# Patient Record
Sex: Female | Born: 1970 | Race: Black or African American | Hispanic: No | Smoking: Never smoker
Health system: Southern US, Community
[De-identification: ages and names within clinical notes are randomized; demographics above are authoritative.]

## PROBLEM LIST (undated history)

## (undated) DIAGNOSIS — J189 Pneumonia, unspecified organism: Secondary | ICD-10-CM

## (undated) DIAGNOSIS — J810 Acute pulmonary edema: Secondary | ICD-10-CM

## (undated) DIAGNOSIS — G4733 Obstructive sleep apnea (adult) (pediatric): Secondary | ICD-10-CM

## (undated) DIAGNOSIS — J9610 Chronic respiratory failure, unspecified whether with hypoxia or hypercapnia: Secondary | ICD-10-CM

## (undated) DIAGNOSIS — D509 Iron deficiency anemia, unspecified: Secondary | ICD-10-CM

## (undated) HISTORY — DX: Iron deficiency anemia, unspecified: D50.9

## (undated) HISTORY — DX: Acute pulmonary edema: J81.0

## (undated) HISTORY — DX: Chronic respiratory failure, unspecified whether with hypoxia or hypercapnia: J96.10

## (undated) HISTORY — DX: Morbid (severe) obesity due to excess calories: E66.01

## (undated) HISTORY — PX: UTERINE FIBROID SURGERY: SHX826

## (undated) HISTORY — PX: REDUCTION MAMMAPLASTY: SUR839

## (undated) HISTORY — DX: Obstructive sleep apnea (adult) (pediatric): G47.33

---

## 2000-12-08 ENCOUNTER — Other Ambulatory Visit: Admission: RE | Admit: 2000-12-08 | Discharge: 2000-12-08 | Payer: Self-pay | Admitting: Obstetrics and Gynecology

## 2003-02-15 ENCOUNTER — Other Ambulatory Visit: Admission: RE | Admit: 2003-02-15 | Discharge: 2003-02-15 | Payer: Self-pay | Admitting: Obstetrics and Gynecology

## 2003-03-08 ENCOUNTER — Ambulatory Visit (HOSPITAL_COMMUNITY): Admission: RE | Admit: 2003-03-08 | Discharge: 2003-03-08 | Payer: Self-pay | Admitting: Obstetrics and Gynecology

## 2003-03-22 ENCOUNTER — Encounter (INDEPENDENT_AMBULATORY_CARE_PROVIDER_SITE_OTHER): Payer: Self-pay

## 2003-03-22 ENCOUNTER — Ambulatory Visit (HOSPITAL_COMMUNITY): Admission: RE | Admit: 2003-03-22 | Discharge: 2003-03-22 | Payer: Self-pay | Admitting: Obstetrics and Gynecology

## 2003-04-21 ENCOUNTER — Ambulatory Visit (HOSPITAL_BASED_OUTPATIENT_CLINIC_OR_DEPARTMENT_OTHER): Admission: RE | Admit: 2003-04-21 | Discharge: 2003-04-21 | Payer: Self-pay | Admitting: Internal Medicine

## 2003-04-21 ENCOUNTER — Encounter: Payer: Self-pay | Admitting: Internal Medicine

## 2003-04-21 DIAGNOSIS — G4733 Obstructive sleep apnea (adult) (pediatric): Secondary | ICD-10-CM

## 2003-04-21 HISTORY — DX: Obstructive sleep apnea (adult) (pediatric): G47.33

## 2003-07-01 ENCOUNTER — Inpatient Hospital Stay (HOSPITAL_COMMUNITY): Admission: EM | Admit: 2003-07-01 | Discharge: 2003-07-07 | Payer: Self-pay | Admitting: *Deleted

## 2003-07-01 DIAGNOSIS — J9610 Chronic respiratory failure, unspecified whether with hypoxia or hypercapnia: Secondary | ICD-10-CM

## 2003-07-01 DIAGNOSIS — J810 Acute pulmonary edema: Secondary | ICD-10-CM

## 2003-07-01 HISTORY — DX: Chronic respiratory failure, unspecified whether with hypoxia or hypercapnia: J96.10

## 2003-07-01 HISTORY — DX: Acute pulmonary edema: J81.0

## 2003-07-03 ENCOUNTER — Encounter (INDEPENDENT_AMBULATORY_CARE_PROVIDER_SITE_OTHER): Payer: Self-pay | Admitting: Cardiology

## 2004-07-08 ENCOUNTER — Encounter: Admission: RE | Admit: 2004-07-08 | Discharge: 2004-10-06 | Payer: Self-pay | Admitting: Internal Medicine

## 2004-09-09 ENCOUNTER — Ambulatory Visit: Payer: Self-pay | Admitting: Internal Medicine

## 2004-10-08 ENCOUNTER — Encounter: Admission: RE | Admit: 2004-10-08 | Discharge: 2005-01-06 | Payer: Self-pay | Admitting: Internal Medicine

## 2004-12-30 ENCOUNTER — Ambulatory Visit: Payer: Self-pay | Admitting: Internal Medicine

## 2005-01-11 ENCOUNTER — Ambulatory Visit: Payer: Self-pay | Admitting: Internal Medicine

## 2005-01-21 ENCOUNTER — Encounter: Admission: RE | Admit: 2005-01-21 | Discharge: 2005-04-21 | Payer: Self-pay | Admitting: Internal Medicine

## 2005-06-03 ENCOUNTER — Ambulatory Visit: Payer: Self-pay | Admitting: Internal Medicine

## 2005-06-11 ENCOUNTER — Encounter: Admission: RE | Admit: 2005-06-11 | Discharge: 2005-09-09 | Payer: Self-pay | Admitting: Internal Medicine

## 2005-08-11 ENCOUNTER — Ambulatory Visit: Payer: Self-pay | Admitting: Pulmonary Disease

## 2005-09-08 ENCOUNTER — Ambulatory Visit: Payer: Self-pay | Admitting: Internal Medicine

## 2005-09-09 ENCOUNTER — Ambulatory Visit: Payer: Self-pay | Admitting: Internal Medicine

## 2005-09-20 ENCOUNTER — Ambulatory Visit: Payer: Self-pay | Admitting: Internal Medicine

## 2005-10-04 ENCOUNTER — Ambulatory Visit: Payer: Self-pay | Admitting: Internal Medicine

## 2005-10-25 ENCOUNTER — Ambulatory Visit: Payer: Self-pay | Admitting: Internal Medicine

## 2005-11-16 ENCOUNTER — Ambulatory Visit: Payer: Self-pay | Admitting: Pulmonary Disease

## 2006-01-17 ENCOUNTER — Ambulatory Visit: Payer: Self-pay | Admitting: Internal Medicine

## 2006-02-23 ENCOUNTER — Ambulatory Visit: Payer: Self-pay | Admitting: Internal Medicine

## 2006-03-11 ENCOUNTER — Encounter (HOSPITAL_COMMUNITY): Admission: RE | Admit: 2006-03-11 | Discharge: 2006-03-14 | Payer: Self-pay | Admitting: Internal Medicine

## 2006-03-14 ENCOUNTER — Emergency Department (HOSPITAL_COMMUNITY): Admission: EM | Admit: 2006-03-14 | Discharge: 2006-03-14 | Payer: Self-pay | Admitting: Emergency Medicine

## 2006-07-19 ENCOUNTER — Ambulatory Visit: Payer: Self-pay | Admitting: Hematology & Oncology

## 2006-07-28 ENCOUNTER — Emergency Department (HOSPITAL_COMMUNITY): Admission: EM | Admit: 2006-07-28 | Discharge: 2006-07-29 | Payer: Self-pay | Admitting: Emergency Medicine

## 2006-08-01 ENCOUNTER — Ambulatory Visit: Payer: Self-pay | Admitting: Internal Medicine

## 2006-08-15 LAB — CBC & DIFF AND RETIC
Basophils Absolute: 0.1 10*3/uL (ref 0.0–0.1)
Eosinophils Absolute: 0.1 10*3/uL (ref 0.0–0.5)
HGB: 10.2 g/dL — ABNORMAL LOW (ref 11.6–15.9)
IRF: 0.47 — ABNORMAL HIGH (ref 0.130–0.330)
MCV: 73.3 fL — ABNORMAL LOW (ref 81.0–101.0)
MONO#: 0.3 10*3/uL (ref 0.1–0.9)
NEUT#: 7.4 10*3/uL — ABNORMAL HIGH (ref 1.5–6.5)
RDW: 18.6 % — ABNORMAL HIGH (ref 11.3–14.5)
RETIC #: 58 10*3/uL (ref 19.7–115.1)
Retic %: 1.3 % (ref 0.4–2.3)
WBC: 10.3 10*3/uL — ABNORMAL HIGH (ref 3.9–10.0)
lymph#: 2.4 10*3/uL (ref 0.9–3.3)

## 2006-08-15 LAB — CHCC SMEAR

## 2006-08-17 LAB — TRANSFERRIN RECEPTOR, SOLUABLE: Transferrin Receptor, Soluble: 102.1 nmol/L

## 2006-09-27 ENCOUNTER — Ambulatory Visit: Payer: Self-pay | Admitting: Hematology & Oncology

## 2006-09-29 LAB — FERRITIN: Ferritin: 268 ng/mL (ref 10–291)

## 2006-09-29 LAB — CBC WITH DIFFERENTIAL/PLATELET
BASO%: 1.1 % (ref 0.0–2.0)
Basophils Absolute: 0.1 10*3/uL (ref 0.0–0.1)
EOS%: 1.5 % (ref 0.0–7.0)
Eosinophils Absolute: 0.2 10*3/uL (ref 0.0–0.5)
HCT: 39.1 % (ref 34.8–46.6)
HGB: 11.6 g/dL (ref 11.6–15.9)
LYMPH%: 25.2 % (ref 14.0–48.0)
MCH: 23.3 pg — ABNORMAL LOW (ref 26.0–34.0)
MCHC: 29.6 g/dL — ABNORMAL LOW (ref 32.0–36.0)
MCV: 78.7 fL — ABNORMAL LOW (ref 81.0–101.0)
MONO#: 0.5 10*3/uL (ref 0.1–0.9)
MONO%: 4.9 % (ref 0.0–13.0)
NEUT#: 7.3 10*3/uL — ABNORMAL HIGH (ref 1.5–6.5)
NEUT%: 67.4 % (ref 39.6–76.8)
Platelets: 511 10*3/uL — ABNORMAL HIGH (ref 145–400)
RBC: 4.97 10*6/uL (ref 3.70–5.32)
RDW: 19.8 % — ABNORMAL HIGH (ref 11.3–14.5)
WBC: 10.9 10*3/uL — ABNORMAL HIGH (ref 3.9–10.0)
lymph#: 2.7 10*3/uL (ref 0.9–3.3)

## 2006-09-29 LAB — CHCC SMEAR

## 2006-10-10 ENCOUNTER — Ambulatory Visit: Payer: Self-pay | Admitting: Internal Medicine

## 2006-11-21 ENCOUNTER — Ambulatory Visit: Payer: Self-pay | Admitting: Hematology & Oncology

## 2006-11-24 LAB — CBC WITH DIFFERENTIAL/PLATELET
Basophils Absolute: 0.1 10*3/uL (ref 0.0–0.1)
EOS%: 1.9 % (ref 0.0–7.0)
HCT: 36.1 % (ref 34.8–46.6)
HGB: 11.8 g/dL (ref 11.6–15.9)
MCH: 26.8 pg (ref 26.0–34.0)
MONO#: 0.3 10*3/uL (ref 0.1–0.9)
NEUT%: 76.2 % (ref 39.6–76.8)
Platelets: 409 10*3/uL — ABNORMAL HIGH (ref 145–400)
lymph#: 1.8 10*3/uL (ref 0.9–3.3)

## 2007-01-24 ENCOUNTER — Ambulatory Visit: Payer: Self-pay | Admitting: Hematology & Oncology

## 2007-01-30 LAB — CBC WITH DIFFERENTIAL/PLATELET
Basophils Absolute: 0.1 10*3/uL (ref 0.0–0.1)
Eosinophils Absolute: 0.2 10*3/uL (ref 0.0–0.5)
HCT: 38.8 % (ref 34.8–46.6)
LYMPH%: 30.1 % (ref 14.0–48.0)
MONO#: 0.4 10*3/uL (ref 0.1–0.9)
NEUT#: 4.8 10*3/uL (ref 1.5–6.5)
NEUT%: 61.6 % (ref 39.6–76.8)
Platelets: 424 10*3/uL — ABNORMAL HIGH (ref 145–400)
WBC: 7.8 10*3/uL (ref 3.9–10.0)

## 2007-02-10 DIAGNOSIS — E678 Other specified hyperalimentation: Secondary | ICD-10-CM

## 2007-02-10 DIAGNOSIS — J302 Other seasonal allergic rhinitis: Secondary | ICD-10-CM

## 2007-02-10 DIAGNOSIS — J3089 Other allergic rhinitis: Secondary | ICD-10-CM

## 2007-02-10 DIAGNOSIS — J45909 Unspecified asthma, uncomplicated: Secondary | ICD-10-CM

## 2007-02-10 DIAGNOSIS — G4733 Obstructive sleep apnea (adult) (pediatric): Secondary | ICD-10-CM

## 2007-02-10 DIAGNOSIS — D649 Anemia, unspecified: Secondary | ICD-10-CM | POA: Insufficient documentation

## 2007-03-23 ENCOUNTER — Ambulatory Visit: Payer: Self-pay | Admitting: Hematology & Oncology

## 2007-04-10 ENCOUNTER — Ambulatory Visit: Payer: Self-pay | Admitting: Internal Medicine

## 2007-04-20 ENCOUNTER — Telehealth (INDEPENDENT_AMBULATORY_CARE_PROVIDER_SITE_OTHER): Payer: Self-pay | Admitting: *Deleted

## 2007-05-04 HISTORY — PX: GASTRIC BYPASS: SHX52

## 2007-06-26 ENCOUNTER — Telehealth (INDEPENDENT_AMBULATORY_CARE_PROVIDER_SITE_OTHER): Payer: Self-pay | Admitting: *Deleted

## 2007-07-14 ENCOUNTER — Ambulatory Visit: Payer: Self-pay | Admitting: Internal Medicine

## 2007-07-20 ENCOUNTER — Telehealth: Payer: Self-pay | Admitting: Internal Medicine

## 2007-09-18 ENCOUNTER — Telehealth: Payer: Self-pay | Admitting: Internal Medicine

## 2007-10-06 ENCOUNTER — Telehealth: Payer: Self-pay | Admitting: Internal Medicine

## 2007-10-16 DIAGNOSIS — Z9884 Bariatric surgery status: Secondary | ICD-10-CM | POA: Insufficient documentation

## 2007-10-17 ENCOUNTER — Encounter: Payer: Self-pay | Admitting: Internal Medicine

## 2007-10-29 ENCOUNTER — Inpatient Hospital Stay (HOSPITAL_COMMUNITY): Admission: AD | Admit: 2007-10-29 | Discharge: 2007-10-29 | Payer: Self-pay | Admitting: Obstetrics & Gynecology

## 2007-11-20 ENCOUNTER — Ambulatory Visit: Payer: Self-pay | Admitting: Internal Medicine

## 2008-02-19 ENCOUNTER — Telehealth (INDEPENDENT_AMBULATORY_CARE_PROVIDER_SITE_OTHER): Payer: Self-pay | Admitting: *Deleted

## 2008-03-04 ENCOUNTER — Ambulatory Visit: Payer: Self-pay | Admitting: Internal Medicine

## 2008-03-06 ENCOUNTER — Telehealth (INDEPENDENT_AMBULATORY_CARE_PROVIDER_SITE_OTHER): Payer: Self-pay | Admitting: *Deleted

## 2008-03-06 ENCOUNTER — Encounter: Payer: Self-pay | Admitting: Internal Medicine

## 2008-03-18 ENCOUNTER — Encounter: Payer: Self-pay | Admitting: Internal Medicine

## 2008-03-26 ENCOUNTER — Telehealth: Payer: Self-pay | Admitting: Internal Medicine

## 2008-05-06 ENCOUNTER — Telehealth: Payer: Self-pay | Admitting: Internal Medicine

## 2008-08-06 ENCOUNTER — Telehealth (INDEPENDENT_AMBULATORY_CARE_PROVIDER_SITE_OTHER): Payer: Self-pay | Admitting: *Deleted

## 2008-08-13 ENCOUNTER — Encounter (HOSPITAL_COMMUNITY): Admission: RE | Admit: 2008-08-13 | Discharge: 2008-11-11 | Payer: Self-pay | Admitting: Internal Medicine

## 2008-09-13 ENCOUNTER — Telehealth: Payer: Self-pay | Admitting: Internal Medicine

## 2008-09-13 ENCOUNTER — Ambulatory Visit: Payer: Self-pay | Admitting: Internal Medicine

## 2008-09-16 ENCOUNTER — Telehealth: Payer: Self-pay | Admitting: Internal Medicine

## 2009-02-24 ENCOUNTER — Encounter: Payer: Self-pay | Admitting: Internal Medicine

## 2009-03-12 ENCOUNTER — Ambulatory Visit: Payer: Self-pay | Admitting: Internal Medicine

## 2009-08-13 ENCOUNTER — Telehealth: Payer: Self-pay | Admitting: Internal Medicine

## 2009-10-03 ENCOUNTER — Ambulatory Visit: Payer: Self-pay | Admitting: Internal Medicine

## 2009-10-08 ENCOUNTER — Encounter: Payer: Self-pay | Admitting: Internal Medicine

## 2009-10-23 ENCOUNTER — Encounter: Payer: Self-pay | Admitting: Internal Medicine

## 2009-10-23 ENCOUNTER — Telehealth (INDEPENDENT_AMBULATORY_CARE_PROVIDER_SITE_OTHER): Payer: Self-pay | Admitting: *Deleted

## 2009-10-30 ENCOUNTER — Encounter: Payer: Self-pay | Admitting: Internal Medicine

## 2009-12-10 ENCOUNTER — Telehealth: Payer: Self-pay | Admitting: Internal Medicine

## 2010-05-07 ENCOUNTER — Ambulatory Visit
Admission: RE | Admit: 2010-05-07 | Discharge: 2010-05-07 | Payer: Self-pay | Source: Home / Self Care | Attending: Internal Medicine | Admitting: Internal Medicine

## 2010-05-23 ENCOUNTER — Encounter: Payer: Self-pay | Admitting: Obstetrics and Gynecology

## 2010-06-02 NOTE — Progress Notes (Signed)
Summary: cpap pressure high  Phone Note Call from Patient Call back at Home Phone [phone]   Caller: Patient Call For: young Reason for Call: Talk to Nurse Summary of Call: cpap pressure is too high.  [employer] told her that she needed to get an order from [facility].  pt says she can't seem to keep it on her face.  Pt is at work, you can leave detailed message.  She would like it if you just called [facility] and give them the order/rx. Initial call taken by: Eugene Gavia,  December 10, 2009 1:52 PM  Follow-up for Phone Call        LMOVM that [facility] is out of office this pm and will address this asap tomorrow. If any questions or concerns call me at the office.Reynaldo Minium CMA  December 10, 2009 2:58 PM   Additional Follow-up for Phone Call Additional follow up Details #1::        I will reduce pressure to 9 empirically. She has lost a lot of weight after surgery. If this isn't comforta ble we will autotitrate. Additional Follow-up by: Waymon Budge MD,  December 10, 2009 3:47 PM    New/Updated Medications: * CPAP 9 CWP [facility]

## 2010-06-02 NOTE — Progress Notes (Signed)
Summary: Set CPAP 14, see phone note  Phone Note Other Incoming   Summary of Call: CPAP form from [facility] shows they have her listed at 66, which was her original pressure. I can't tell when our records began listing at 10, which may be more appropriate after her weight loss. I wil lhave her set to 14 to split the difference and see how she does. Initial call taken by: Waymon Budge MD,  October 23, 2009 9:19 PM  Follow-up for Phone Call        order given to [facility] to set cpap@14cm   Follow-up by: Oneita Jolly,  October 24, 2009 9:10 AM    New/Updated Medications: * CPAP 14 CWP [facility]

## 2010-06-02 NOTE — Letter (Signed)
Summary: CMN for PAP Supplies/[facility]  CMN for PAP Supplies/[facility]   Imported By: Sherian Rein 11/05/2009 13:47:35  _____________________________________________________________________  External Attachment:    Type:   Image     Comment:   External Document

## 2010-06-02 NOTE — Letter (Signed)
Summary: PAP Supplies/[facility]  PAP Supplies/[facility]   Imported By: Sherian Rein 11/10/2009 09:39:50  _____________________________________________________________________  External Attachment:    Type:   Image     Comment:   External Document

## 2010-06-02 NOTE — Miscellaneous (Signed)
Summary: Orders Update-CPAP orders  Clinical Lists Changes  Orders: Added new Referral order of DME Referral (DME) - Signed

## 2010-06-02 NOTE — Assessment & Plan Note (Signed)
Summary: rov 6 months///kp   Primary Provider/Referring Provider:  Dorothyann Peng  CC:  6 month follow up visit-waking up with CPAP off; ? pressure change. Marland Kitchen  History of Present Illness: 03/04/08- Had CT for fibroids and may be facing surgery. Breathing well, much better sinc post bari surgery weight loss. Ia walking regularly on treadmill at gym. Not needing symbicort or nebulizer. Continues cpap 10, but no longer using O2 during sleep. Had flu vax.  09/13/08- Obesity/ bariatirc sgy, OSA, asthma, rhinits For hysterectomy next week. C/o pressure left ear with air noise but no pain. Did have some dizziness.Has appt to see Dr Lazarus Salines for this.  Asthma doing well. No recent infection. Little if any wheeze or cough. Denies chest pain, palpitation, dyspnea, leg pain. bloody or purulent discharge.  Compliant with cpap still at 10. Comfortable and sleeps well.   March 12, 2009- Obesity/ bariatric sgy, OSA, asthma, rhinitis Had hysterectomy w/out probs. She's very proud of her weight loss. She ordered cpap mask replacement on line. Continues good compliance at 10 but sometimes finds she's taken it off in her sleep. She's aware she snores without and sleeps better with cpap. Doing Yoga and going to gym.  October 03, 2009- Obesity/ bariatric surgery, OSA, asthma, rhinitis Has lost over 200 lbs after surgery. Walks everyday, going to gym.  She is taking CPAP off in her sleep now. Still falls asleep on phone and during day, but not while driving.. Brings CPAP but thinks mask fit is good, mask is comfortable. Asks to avoid a DME service charge. Working 2 jobs [employer] home 230-3AM, gets up [employer] up on weekends.  Taking Zyrtec for allergic rhinitis. Couldn't afford allergy shots.   Current Medications (verified): 1)  Protonix 40 Mg  Tbec (Pantoprazole Sodium) .... Take 1 Tablet By Mouth Two Times A Day 2)  Cpap .Marland Kitchen.. 10cm H2o Pressure 3)  Vitamin D (Ergocalciferol) 50000 Unit Caps (Ergocalciferol)  .... Weekly 4)  Singulair 10 Mg  Tabs (Montelukast Sodium) .Marland Kitchen.. 1 Daily 5)  Symbicort 160-4.5 Mcg/act  Aero (Budesonide-Formoterol Fumarate) .... 2 Puffs Two Times A Day 6)  Zoloft 100 Mg Tabs (Sertraline Hcl) .... Take 1 By Mouth Once Daily 7)  Iron 8)  Loratadine 10 Mg Tabs (Loratadine) .... Take 1 By Mouth Once Daily 9)  Deplin 15 Mg Tabs (L-Methylfolate) .... Take 1 By Mouth Once Daily 10)  Zyrtec Hives Relief 10 Mg Tabs (Cetirizine Hcl) .... Take 1 By Mouth Once Daily  Allergies (verified): No Known Drug Allergies  Past History:  Past Surgical History: Last updated: 11/20/2007 Uterine fibroid Gastric Bypass bariatric surgery 2009  Family History: Last updated: 07/14/2007 Father- asthma  Social History: Last updated: 03/12/2009 Patient never smoked.  Divorced, no children Works Location manager for [employer], also Research scientist (life sciences) and sorting  Risk Factors: Smoking Status: never (07/14/2007)  Past Medical History: hx Morbid obesity- bariatric surgery Allergic rhinitis Iron deficiency anemia due to menometrorrhagia Hxs of acute pulmonary edema, acute and chronic repiratory failure/ hosp 07/01/03 Obstructive sleep apnea NPSG AHI 101/hr, desat to 71% 04/21/03  Review of Systems      See HPI       The patient complains of weight loss.  The patient denies anorexia, fever, weight gain, vision loss, decreased hearing, hoarseness, chest pain, syncope, dyspnea on exertion, peripheral edema, prolonged cough, headaches, hemoptysis, abdominal pain, melena, hematochezia, and severe indigestion/heartburn.    Vital Signs:  Patient profile:   40 year old female Height:  64 inches Weight:      272 pounds BMI:     46.86 O2 Sat:      100 % on Room air Pulse rate:   65 / minute BP sitting:   110 / 66  (left arm) Cuff size:   regular  Vitals Entered By: Reynaldo Minium CMA (October 03, 2009 4:31 PM)  O2 Flow:  Room air  Physical Exam  Additional Exam:  General: A/Ox3; pleasant  and cooperative, NAD, obese, cheerfull,   SKIN: no rash, lesions NODES: no lymphadenopathy HEENT: [place]/AT, EOM- WNL, Conjuctivae- clear, PERRLA, TM-WNL- clear., Nose- clear, Throat- clear and wnl. Melampatti II=III NECK: Supple w/ fair ROM, JVD- none, normal carotid impulses w/o bruits Thyroid-  CHEST: Clear to P&A, shallow consisitent with habitus., Unlabored. HEART: RRR, no m/g/r heard ABDOMEN: Still obese but has lost a lot of weight VQQ:VZDG, nl pulses, no edema, no cords. NEURO: Grossly intact to observation. Affect pleasant- smiling and interactive.      Impression & Recommendations:  Problem # 1:  ALLERGIC RHINITIS (ICD-477.9)  Seasonal flare this Spring. Suggest nonsedating allegra or claritin. Her updated medication list for this problem includes:    Loratadine 10 Mg Tabs (Loratadine) .Marland Kitchen... Take 1 by mouth once daily    Zyrtec Hives Relief 10 Mg Tabs (Cetirizine hcl) .Marland Kitchen... 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 She will take the machine to her DME for service. She has not been getting enough sleep, but needs to address that, not change the cpap setting. We discussed sleep hygiene.  Medications Added to Medication List This Visit: 1)  Protonix 40 Mg Tbec (Pantoprazole sodium) .... Take 1 tablet by mouth two times a day 2)  Vitamin D (ergocalciferol) 50000 Unit Caps (Ergocalciferol) .... Weekly 3)  Deplin 15 Mg Tabs (L-methylfolate) .... Take 1 by mouth once daily 4)  Zyrtec Hives Relief 10 Mg Tabs (Cetirizine hcl) .... Take 1 by mouth once daily  Other Orders: Est. Patient Level IV (38756)  Patient Instructions: 1)  Please schedule a follow-up appointment in 6 months. 2)  See [facility] to look up who your CPAP home care company was. You can take your  machine to them for servce and filter change. 3)  Pressure remains at 10 4)  OK to try a Nodoz/ caffeine tablet once in a while, but healthy adults need to be getting 7-8 hours of sleep most nights.

## 2010-06-02 NOTE — Progress Notes (Signed)
Summary: rx-pt returned call from nurse  Phone Note Call from Patient Call back at Home Phone [phone]   Caller: Patient Call For: Mir Fullilove Reason for Call: Talk to Nurse Summary of Call: Eyes burning, itching, white discharge and swollen.  Thinks it is pollen, taking her Singulair and OTC Claritin.  Can you call something in? [facility] - [address] [facility] [address]. Initial call taken by: Eugene Gavia,  August 13, 2009 8:14 AM  Follow-up for Phone Call        [facility]. Is she taking her Loratadine?Michel Bickers CMA  August 13, 2009 10:33 AM  PT RETURNED CALL FROM TRIAGE. Tivis Ringer, CNA  August 13, 2009 12:33 PM  Additional Follow-up for Phone Call Additional follow up Details #1::        Pt c/o left eye itching, burning, running, swollen, with white discharge on eye lashes and corners of eye when she wakes up in the AM. Also PND, scratchy throat. Pt states her eye opens with ease. Pt has been rubbing her eye ( I did advise to stop doing that and use cool compresses) due to it feeling like sand is in it.    Pt is taking Singulair and OTC Claritin at the same time in the AM. Pt states she stopped her allergy vaccines years ago due to finances. Requesting something called to pharmacy. Pt has upcoming appointment Sep 11, 2009. Please advise. Zackery Barefoot CMA  August 13, 2009 12:55 PM   NKDA    Additional Follow-up for Phone Call Additional follow up Details #2::    Try Allway or Visine-AC first. these are otc and likely to be cheaper than prescription eye drops. Can also try Zyrtec/ cetirizine otc antihistamine and see if that works better than Claritin.--per dr Jahi Roza   lmtcb   Philipp Deputy CMA  August 13, 2009 1:46 PM   pt advised.Carron Curie CMA  August 13, 2009 3:29 PM

## 2010-06-04 NOTE — Assessment & Plan Note (Signed)
Summary: rov/jd   Primary Provider/Referring Provider:  Dorothyann Peng  CC:  Follow up visit-sleep no complaints and Acute illness today-slight cough, nasal congestion, sneezing, and denies any fever/chills or drainage.[name] [name]  History of Present Illness: March 12, 2009- Obesity/ bariatric sgy, OSA, asthma, rhinitis Had hysterectomy w/out probs. She's very proud of her weight loss. She ordered cpap mask replacement on line. Continues good compliance at 10 but sometimes finds she's taken it off in her sleep. She's aware she snores without and sleeps better with cpap. Doing Yoga and going to gym.  October 03, 2009- Obesity/ bariatric surgery, OSA, asthma, rhinitis Has lost over 200 lbs after surgery. Walks everyday, going to gym.  She is taking CPAP off in her sleep now. Still falls asleep on phone and during day, but not while driving.. Brings CPAP but thinks mask fit is good, mask is comfortable. Asks to avoid a DME service charge. Working 2 jobs [employer] home 230-3AM, gets up [employer] up on weekends.  Taking Zyrtec for allergic rhinitis. Couldn't afford allergy shots.  05/28/2010- Obesity/ bariatric surgery, OSA, asthma, rhinitis Nurse-CC: Follow up visit-sleep no complaints and Acute illness today-slight cough,nasal congestion,sneezing, denies any fever/chills or drainage. CPAP- pressure reduced empirically to 9. [facility]. Says this presure is now comfortable used all night every night.  Today is acutely ill also with bad cold that started yesterday, head congestion, sore throat, clear mucus, no fever yet. Chest still clear. Hx of asthma, but has not needed meds for this in quite a while.     Asthma History    Initial Asthma Severity Rating:    Age range: 12+ years    Symptoms: 0-2 days/week    Nighttime Awakenings: 0-2/month    Interferes w/ normal activity: no limitations    SABA use (not for EIB): 0-2 days/week    Asthma Severity Assessment: Intermittent   Preventive  Screening-Counseling & Management  Alcohol-Tobacco     Smoking Status: never  Current Medications (verified): 1)  Cpap 9 Cwp [facility] 2)  Vitamin D (Ergocalciferol) 50000 Unit Caps (Ergocalciferol) .... Take 1 By Mouth Tuesday and Fridays 3)  Singulair 10 Mg  Tabs (Montelukast Sodium) .[name] [name].. 1 Daily 4)  Zoloft 100 Mg Tabs (Sertraline Hcl) .... Take 2 By Mouth Once Daily 5)  Loratadine 10 Mg Tabs (Loratadine) .... Take 1 By Mouth Once Daily 6)  Deplin 15 Mg Tabs (L-Methylfolate) .... Take 1 By Mouth Once Daily 7)  Zyrtec Hives Relief 10 Mg Tabs (Cetirizine Hcl) .... Take 1 By Mouth Once Daily 8)  Ferralet 90 90-1 Mg Tabs (Fe Cbn-Fe Gluc-Fa-B12-C-Dss) .... Take 1 By Mouth Once Daily 9)  Eql Allergy Relief 10 Mg Tbdp (Loratadine) .... Take 1 By Mouth Once Daily As Needed 10)  Naproxen Sodium 220 Mg Tabs (Naproxen Sodium) .... Take As Needed As Directed Per Bottle  Allergies (verified): No Known Drug Allergies  Past History:  Past Medical History: Last updated: 10/03/2009 hx Morbid obesity- bariatric surgery Allergic rhinitis Iron deficiency anemia due to menometrorrhagia Hxs of acute pulmonary edema, acute and chronic repiratory failure/ hosp 07/01/03 Obstructive sleep apnea NPSG AHI 101/hr, desat to 71% 04/21/03  Past Surgical History: Last updated: 11/20/2007 Uterine fibroid Gastric Bypass bariatric surgery 2009  Family History: Last updated: 28-May-2010 Father- asthma Mother- died unknown cause  Social History: Last updated: 03/12/2009 Patient never smoked.  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  The patient denies shortness of breath at rest, productive  cough, non-productive cough, coughing up blood, chest pain, irregular heartbeats, acid heartburn, indigestion, loss of appetite, weight change, abdominal pain, difficulty swallowing, sore throat, tooth/dental problems, headaches, ear ache, rash, change in color of mucus, and fever.    Vital Signs:  Patient profile:   40 year old female Height:      64 inches Weight:      284.13 pounds BMI:     48.95 O2 Sat:      96 % on Room air Pulse rate:   80 / minute BP sitting:   126 / 82  (right arm) Cuff size:   large  Vitals Entered By: Reynaldo Minium CMA (May 07, 2010 3:59 PM)  O2 Flow:  Room air CC: Follow up visit-sleep no complaints and Acute illness today-slight cough,nasal congestion,sneezing, denies any fever/chills or drainage.   Physical Exam  Additional Exam:  General: A/Ox3; pleasant and cooperative, NAD, obese, cheerfull,   SKIN: no rash, lesions NODES: no lymphadenopathy HEENT: [place]/AT, EOM- WNL, Conjuctivae- clear, PERRLA, TM-WNL- clear., Nose- clear, Throat- clear and wnl. Mallampati  II=III NECK: Supple w/ fair ROM, JVD- none, normal carotid impulses w/o bruits Thyroid-  CHEST: Clear to P&A, shallow consisitent with habitus., Unlabored. HEART: RRR, no m/g/r heard ABDOMEN: Still obese but has lost a lot of weight ZOX:WRUE, nl pulses, no edema, no cords. NEURO: Grossly intact to observation. Affect pleasant- smiling and interactive.      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 further weight loss would help.  Problem # 2:  OBSTRUCTIVE SLEEP APNEA (ICD-327.23)  Good compliance and control with no change necessary on review with her now.  She does want mask refit as she has lost weight.   Problem # 3:  ALLERGIC RHINITIS (ICD-477.9)  She has a viral pattern URI, so far not extending into her chest. We are concerned about possible progression.  Her updated medication list for this problem  includes:    Loratadine 10 Mg Tabs (Loratadine) .[name] [name]... Take 1 by mouth once daily    Zyrtec Hives Relief 10 Mg Tabs (Cetirizine hcl) .[name] [name]... Take 1 by mouth once daily    Eql Allergy Relief 10 Mg Tbdp (Loratadine) .[name] [name]... Take 1 by mouth once daily as needed  Medications Added to Medication List This Visit: 1)  Cpap Mask of Choice and Supplies  2)  Vitamin D (ergocalciferol) 50000 Unit Caps (Ergocalciferol) .... Take 1 by mouth tuesday and fridays 3)  Zoloft 100 Mg Tabs (Sertraline hcl) .... Take 2 by mouth once daily 4)  Ferralet 90 90-1 Mg Tabs (Fe cbn-fe gluc-fa-b12-c-dss) .... Take 1 by mouth once daily 5)  Eql Allergy Relief 10 Mg Tbdp (Loratadine) .... Take 1 by mouth once daily as needed 6)  Naproxen Sodium 220 Mg Tabs (Naproxen sodium) .... Take as needed as directed per bottle 7)  Zithromax Z-pak 250 Mg Tabs (Azithromycin) .... 2 today then one daily  Other Orders: Est. Patient Level III (45409)  Patient Instructions: 1)  Please schedule a follow-up appointment in 6 months. 2)  Fluids, mucinex (plain) and maybe a decongestant like Afrin nasal spray or phenylephrine may help your cold 3)  script  to hld for Z pak in case you need an antibiotic later 4)  Continue CPAPat 9 5)  Replacement CPAP mask script Prescriptions: ZITHROMAX Z-PAK 250 MG TABS (AZITHROMYCIN) 2 today then one daily  #1 pak x 0   Entered and Authorized by:   Waymon Budge MD   Signed by:   Waymon Budge MD on 05/07/2010   Method used:   Print then Give to Patient   RxID:   [id number] CPAP MASK OF CHOICE AND SUPPLIES   #1 x prn   Entered and Authorized by:   Waymon Budge MD   Signed by:   Waymon Budge MD on 05/07/2010   Method used:   Print then Give to Patient   RxID:   [id number]

## 2010-08-03 ENCOUNTER — Emergency Department (HOSPITAL_BASED_OUTPATIENT_CLINIC_OR_DEPARTMENT_OTHER)
Admission: EM | Admit: 2010-08-03 | Discharge: 2010-08-04 | Disposition: A | Discharge status: Home / Self Care | Payer: Managed Care, Other (non HMO) | Attending: Emergency Medicine | Admitting: Emergency Medicine

## 2010-08-03 DIAGNOSIS — Z9109 Other allergy status, other than to drugs and biological substances: Secondary | ICD-10-CM | POA: Insufficient documentation

## 2010-08-03 DIAGNOSIS — R109 Unspecified abdominal pain: Secondary | ICD-10-CM | POA: Insufficient documentation

## 2010-08-03 DIAGNOSIS — E669 Obesity, unspecified: Secondary | ICD-10-CM | POA: Insufficient documentation

## 2010-08-03 DIAGNOSIS — K219 Gastro-esophageal reflux disease without esophagitis: Secondary | ICD-10-CM | POA: Insufficient documentation

## 2010-08-03 DIAGNOSIS — Z79899 Other long term (current) drug therapy: Secondary | ICD-10-CM | POA: Insufficient documentation

## 2010-08-03 DIAGNOSIS — G4730 Sleep apnea, unspecified: Secondary | ICD-10-CM | POA: Insufficient documentation

## 2010-08-03 DIAGNOSIS — J45909 Unspecified asthma, uncomplicated: Secondary | ICD-10-CM | POA: Insufficient documentation

## 2010-08-04 ENCOUNTER — Ambulatory Visit (HOSPITAL_BASED_OUTPATIENT_CLINIC_OR_DEPARTMENT_OTHER)
Admission: RE | Admit: 2010-08-04 | Discharge: 2010-08-04 | Disposition: A | Discharge status: Home / Self Care | Payer: Managed Care, Other (non HMO) | Source: Ambulatory Visit | Attending: Emergency Medicine | Admitting: Emergency Medicine

## 2010-08-04 DIAGNOSIS — R112 Nausea with vomiting, unspecified: Secondary | ICD-10-CM

## 2010-08-04 DIAGNOSIS — Z9884 Bariatric surgery status: Secondary | ICD-10-CM

## 2010-08-04 DIAGNOSIS — R1013 Epigastric pain: Secondary | ICD-10-CM | POA: Insufficient documentation

## 2010-08-04 LAB — URINALYSIS, ROUTINE W REFLEX MICROSCOPIC
Bilirubin Urine: NEGATIVE
Glucose, UA: NEGATIVE mg/dL
Hgb urine dipstick: NEGATIVE
Ketones, ur: NEGATIVE mg/dL
Nitrite: NEGATIVE
Protein, ur: NEGATIVE mg/dL
Specific Gravity, Urine: 1.027 (ref 1.005–1.030)
Urobilinogen, UA: 0.2 mg/dL (ref 0.0–1.0)
pH: 5.5 (ref 5.0–8.0)

## 2010-08-09 ENCOUNTER — Emergency Department (HOSPITAL_BASED_OUTPATIENT_CLINIC_OR_DEPARTMENT_OTHER)
Admission: EM | Admit: 2010-08-09 | Discharge: 2010-08-09 | Disposition: A | Discharge status: Home / Self Care | Payer: Managed Care, Other (non HMO) | Attending: Emergency Medicine | Admitting: Emergency Medicine

## 2010-08-09 DIAGNOSIS — G4730 Sleep apnea, unspecified: Secondary | ICD-10-CM | POA: Insufficient documentation

## 2010-08-09 DIAGNOSIS — Z79899 Other long term (current) drug therapy: Secondary | ICD-10-CM | POA: Insufficient documentation

## 2010-08-09 DIAGNOSIS — E669 Obesity, unspecified: Secondary | ICD-10-CM | POA: Insufficient documentation

## 2010-08-09 DIAGNOSIS — J45909 Unspecified asthma, uncomplicated: Secondary | ICD-10-CM | POA: Insufficient documentation

## 2010-08-09 DIAGNOSIS — K219 Gastro-esophageal reflux disease without esophagitis: Secondary | ICD-10-CM | POA: Insufficient documentation

## 2010-08-09 DIAGNOSIS — B379 Candidiasis, unspecified: Secondary | ICD-10-CM | POA: Insufficient documentation

## 2010-08-09 DIAGNOSIS — R112 Nausea with vomiting, unspecified: Secondary | ICD-10-CM | POA: Insufficient documentation

## 2010-08-09 DIAGNOSIS — R1031 Right lower quadrant pain: Secondary | ICD-10-CM | POA: Insufficient documentation

## 2010-08-09 LAB — COMPREHENSIVE METABOLIC PANEL
ALT: 24 U/L (ref 0–35)
AST: 29 U/L (ref 0–37)
Albumin: 4 g/dL (ref 3.5–5.2)
Alkaline Phosphatase: 125 U/L — ABNORMAL HIGH (ref 39–117)
BUN: 15 mg/dL (ref 6–23)
CO2: 20 mEq/L (ref 19–32)
Calcium: 8.8 mg/dL (ref 8.4–10.5)
Chloride: 113 mEq/L — ABNORMAL HIGH (ref 96–112)
Creatinine, Ser: 0.8 mg/dL (ref 0.4–1.2)
GFR calc Af Amer: >60 mL/min (ref >60)
GFR calc non Af Amer: >60 mL/min (ref >60)
Glucose, Bld: 102 mg/dL — ABNORMAL HIGH (ref 70–99)
Potassium: 4.2 mEq/L (ref 3.5–5.1)
Sodium: 147 mEq/L — ABNORMAL HIGH (ref 135–145)
Total Bilirubin: 0.3 mg/dL (ref 0.3–1.2)
Total Protein: 7.9 g/dL (ref 6.0–8.3)

## 2010-08-09 LAB — URINALYSIS, ROUTINE W REFLEX MICROSCOPIC
Bilirubin Urine: NEGATIVE
Glucose, UA: NEGATIVE mg/dL
Ketones, ur: NEGATIVE mg/dL
Leukocytes, UA: NEGATIVE
Nitrite: NEGATIVE
Protein, ur: NEGATIVE mg/dL
Specific Gravity, Urine: 1.028 (ref 1.005–1.030)
Urobilinogen, UA: 0.2 mg/dL (ref 0.0–1.0)
pH: 5.5 (ref 5.0–8.0)

## 2010-08-09 LAB — CBC
HCT: 34.3 % — ABNORMAL LOW (ref 36.0–46.0)
Hemoglobin: 10.8 g/dL — ABNORMAL LOW (ref 12.0–15.0)
MCH: 24.6 pg — ABNORMAL LOW (ref 26.0–34.0)
MCHC: 31.5 g/dL (ref 30.0–36.0)
MCV: 78.1 fL (ref 78.0–100.0)
Platelets: 440 10*3/uL — ABNORMAL HIGH (ref 150–400)
RBC: 4.39 MIL/uL (ref 3.87–5.11)
RDW: 16.8 % — ABNORMAL HIGH (ref 11.5–15.5)
WBC: 10.2 10*3/uL (ref 4.0–10.5)

## 2010-08-09 LAB — DIFFERENTIAL
Basophils Absolute: 0 10*3/uL (ref 0.0–0.1)
Basophils Relative: 0 % (ref 0–1)
Eosinophils Absolute: 0.3 10*3/uL (ref 0.0–0.7)
Eosinophils Relative: 3 % (ref 0–5)
Lymphocytes Relative: 31 % (ref 12–46)
Lymphs Abs: 3.1 10*3/uL (ref 0.7–4.0)
Monocytes Absolute: 0.7 10*3/uL (ref 0.1–1.0)
Monocytes Relative: 7 % (ref 3–12)
Neutro Abs: 6 10*3/uL (ref 1.7–7.7)
Neutrophils Relative %: 59 % (ref 43–77)

## 2010-08-09 LAB — URINE MICROSCOPIC-ADD ON

## 2010-08-09 LAB — PREGNANCY, URINE: Preg Test, Ur: NEGATIVE

## 2010-08-09 LAB — LIPASE, BLOOD: Lipase: 29 U/L (ref 23–300)

## 2010-08-12 LAB — CROSSMATCH
ABO/RH(D): B POS
Antibody Screen: NEGATIVE

## 2010-09-15 NOTE — Assessment & Plan Note (Signed)
[facility]                             PULMONARY OFFICE NOTE   NAME:Guterrez, NILZA EAKER                      MRN:          [id number]  DATE:10/10/2006                            DOB:          08-23-1970    PROBLEM LIST:  1. Asthma.  2. Obstructive sleep apnea.  3. Morbid obesity.  4. Chronic hypoxic respiratory failure secondary to obesity      hypoventilation syndrome.  5. Allergic rhinitis.   HISTORY:  She complains that she can't get her breath.  No cough,  wheeze, chest pain.  She sleeps with an oxygen concentrator running at  2L but does not have portable oxygen.  She does use CPAP at 10 CWP.  She  is exploring bariatric surgery in [place].  The program here  needed her to lose weight first.  She had a blood transfusion and iron  replacement and is now on Depo-Lupron to suppress vaginal bleeding.  Anemia managed through Dr. Myna Hidalgo.   MEDICATIONS:  1. CPAP 10 CWP.  2. Furosemide 40 mg.  3. Protonix 40 mg.  4. Vitamin C.  5. Oxygen at 2L through [facility], used so far just at night.   PHYSICAL EXAMINATION:  VITAL SIGNS:  Weight per patient is 469 pounds.  Blood pressure 142/88. Pulse 101.  Oxygen saturation 98% on 2L, 81% on  room air.  She is wearing her oxygen during this exam.  She is morbidly  obese.  LUNGS:  Breath sounds are very shallow consistent with her body habitus,  but otherwise clear.  NECK:  I can't distinguish neck veins.  There is no stridor.  HEART:  Sounds are regular.  I don't hear a murmur.  EXTREMITIES:  There is no ankle edema.  No cyanosis.   IMPRESSION:  1. Obesity hypoventilation syndrome secondary to morbid obesity.  2. Hypoxic respiratory failure.  3. Asthma and allergic rhinitis.  4. Anemia compounding her dyspnea.  I think the acute change in the      last few days likely reflects the air quality problems superimposed      on her difficulty taking a deep breath.   PLAN:  1. I have  explained obesity hypoventilation syndrome to her,      demonstrated how she simply can't take much of a breath and we      discussed weight loss.  We are going to add portable oxygen using a      light portable system that she can cope with through [facility].  Today she is given a nebulizer treatment with Xopenex      1.25 mg to see if that has any      effect.  At her request, she is given a leave of absence for one      week and we discussed      disability application.  2. Schedule a return for two months, earlier p.r.n.     Clinton D. [name], MD, Tonny Bollman, FACP  Electronically Signed    [facility]  DD: 10/13/2006  DT: 10/13/2006  Job #:  [id number] 

## 2010-09-18 NOTE — H&P (Signed)
NAME:  [name] [name], [name] [name]                         ACCOUNT NO.:  [id number]   MEDICAL RECORD NO.:  [id number]                   PATIENT TYPE:  INP   LOCATION:  1825                                 FACILITY:  [facility]   PHYSICIAN:  Clinton D. [name], M.D.              DATE OF BIRTH:  January 16, 1971   DATE OF ADMISSION:  07/01/2003  DATE OF DISCHARGE:                                HISTORY & PHYSICAL   ADMISSION DIAGNOSES:  1. Pulmonary infiltrates.  2. Acute and toxic respiratory failure.  3. Asthma.   HISTORY OF PRESENT ILLNESS:  This 40 year old black female, nonsmoker, is  admitted with chief complaint of shortness of breath after presenting at our  office today by Dr. Webb Laws, who asked me to evaluate for admission.  Over the past two months, she has had gradually worsening exertional dyspnea  such that she cannot walk across the parking lot to get to her employment.  When she was seen in the office on January 27, a chest x-ray was interpreted  as showing changes of obesity, and she was thought to have an asthma  exacerbation because of significant wheeze and cough.  Her last previous  asthma flare had been in September.  She was treated in January with Solu-  Medrol, then a Medrol taper, Advair 500/50, Singulair and a Z-Pak and  instructed to return in a week.  She returned today saying the dyspnea had  been worse, and she was afraid last night that she might die.   REVIEW OF SYSTEMS:  Some substernal tussive discomfort, but no anginal  pattern pain or pleuritic pains, no palpitation.  She denies sputum, rigors  or fever.  There has been no adenopathy, and nothing bloody or purulent.  She slept on 1-2 pillows noting leg edema, right greater than left without  leg pain.  She had more wheeze and cough in the early stage of this illness,  but says these actually improved some in the last few weeks.  Dyspnea is the  overwhelming complaint now.   PAST MEDICAL HISTORY:  1. Allergic  rhinitis as a teenager.  She began noting wheeze and cough about     5 years ago.  2. She gained up from 160 pounds in 1997 to over 350 pounds now.  3. Denies heart disease, DVT/pulmonary embolism, history of kidney or liver     disease or any other lung disease.  4. Denies exposure to tuberculosis and has not had flu or pneumococcal     vaccine.  5. Denies risks for HIV.  6. Hospitalization only for uterine fibroid.  7. Has no children.  8. She had a nocturnal polysomnogram at the [facility] one     year ago, and thinks it showed some apnea but is not sure.  She was told     that she needed to loose weight but was not fitted with CPAP.  SOCIAL HISTORY:  Divorced, never smoked, lives alone, no children, no close  relatives around here.  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   FAMILY HISTORY:  Father had asthma, lives in [place] with no recent  contact.  She has no contact with her mother, and apparently there are no  close relatives.   PHYSICAL EXAMINATION:  VITAL SIGNS:  Now distinctly more comfortable than  she was in the office, lying with head of bed elevated 45 degrees, wearing  oxygen at 4 liters.  Saturation was 60% in the office on room air and is 98%  now.  Heart rate 102, office blood pressure was 120/87, weight recorded well  over 350 pounds.  GENERAL APPEARANCE:  She is an alert, now much calmer, oriented woman,  massively obese.  SKIN:  No rash.  No adenopathy.  HEENT:  Mucosa clear.  No neck vein distention or stridor.  Conjunctivae are  clear.  Pupils reactive.  CHEST:  Shallow, blunt and expiratory wheeze in the office, quite labored  then.  No rhonchi.  Breath sounds are so distant I really cannot hear any  rales or wheeze, but she did have some wheeze in the office.  HEART:  Regular rhythm.  Very faint and distant.  I hear no rub or murmur.  BREAST:  Massive without discharge or palpable  mass.  ABDOMEN:  Softly obese.  Very quite.  No hepatosplenomegaly detectable.  GU/RECTAL:  No examined.  Not directly pertinent now.  EXTREMITIES:  Heavy legs with 1+ edema bilaterally to the knees.  Probably a  little worse in the right leg.  No obvious tenderness or cords.  Negative  Homan's.   CHEST X-RAY:  Our office film showed cardiomegaly with diffuse air space  disease, pneumonia versus edema, worse than a mild but similar pattern in  late January.  Previous films had shown no significant air space disease  allowing for obesity.   IMPRESSION:  1. Pending all lab work, I am impressed we are seeing gradually worsening     dyspnea, cough with infiltrates.  The contact suggests  progressive     edema, possibly cardiogenic.  I have not identified a source per an ARDS     or pneumocystis pneumonia.  I suppose an influenza and pneumonia are     similar.  Atypical pneumonia is a possibility.  2. Past history of asthma.  3. Morbid obesity.   PLAN:  Pending labs, we are going to diurese her and start Avelox against  the possibility of bacterial infection.  Will cover with Lovenox for DVT  prophylaxis, Doppler her legs and check D. dimer.  She seems more  comfortable now, but we will put her on BiPAP if necessary.  A lot depends  on how she responds to initial interventions.                                                Clinton D. [name], M.D.    CDY/MEDQ  D:  07/01/2003  T:  07/01/2003  Job:  [id number]

## 2010-09-18 NOTE — Discharge Summary (Signed)
NAME:  [name] [name], [name] [name]                         ACCOUNT NO.:  [id number]   MEDICAL RECORD NO.:  [id number]                   PATIENT TYPE:  INP   LOCATION:  5727                                 FACILITY:  [facility]   PHYSICIAN:  Clinton D. [name], M.D.              DATE OF BIRTH:  14-May-1970   DATE OF ADMISSION:  07/01/2003  DATE OF DISCHARGE:  07/07/2003                                 DISCHARGE SUMMARY   DISCHARGE DIAGNOSES:  1. Acute pulmonary edema.  2. 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  3. Asthma.  4. Morbid obesity.  5. Obstructive sleep apnea with hypersomnolence.  6. Iron-deficiency anemia secondary to menstrual blood loss.  7. Hypopotassemia.   BRIEF HISTORY:  This is a 40 year old non-smoking African-American woman  followed for primary medical care by Dr. Dossie Arbour and followed in our  office by Dr. Webb Laws for allergic asthma, who presented through the  office with a two-month history of gradually increasing dyspnea.  She had  been treated on an outpatient basis for asthma.  But chest x-ray on my  review in January suggested vascular congestion and, on the day of  admission, frank diffuse air space disease consistent with pulmonary edema.  Chest x-rays were technically difficult because of her body habitus.  She  was admitted because of worsening dyspnea.   PAST MEDICAL HISTORY:  1. Asthma.  2. Allergic rhinitis.  3. Weight gain from 160 pounds in 1997 to recorded weight during current     hospitalization of 416 pounds.  4. Obstructive sleep apnea, intolerance of initial trial of CPAP and BiPAP.  5. Surgery for uterine fibroids.   Physical examination on admission was significant for oxygen saturation of  98% on 4 liter prongs with head of bed elevated.  Weight measured in  hospital 416 pounds. There was no neck vein distention or stridor and no  adenopathy or rash.  Respiratory pattern was grunting with expiratory wheeze  bilaterally muffled by her chest wall.  There was 1+ bilateral leg edema to  the knees.  She was admitted for diuresis and supplemental oxygen and  diagnostic evaluation.   HOSPITAL COURSE:  She diuresed some on the first day with initial B-  natruretic peptide of 96, white count 13,600.  No evidence of acute coronary  damage.  D-dimer 1.5 with negative Dopplers.  Cardiology was consulted, and  she was seen by [facility] with impression she was not in  left-sided heart failure.  She was too heavy to fit on a gurney for a CT.  Ventilation perfusion lung scan and Dopplers of legs were low probability.  She was noted to be anemic with an iron deficiency pattern, and she was  having pelvic bleeding.  Dr. Matthias Hughs saw her in gastroenterology  consultation and did a flexible sigmoidoscopy with the impression that this  was probably menstrual bleeding.  She was  transiently hypokalemic, and this  was corrected.  Her leukocytosis resolved, raising suggestion she had an  acute infection as part of the process, although chest x-ray pattern was not  typical for a bacterial pneumonia.  A viral pneumonia could not be  completely excluded.  She had been maintained on DVT prophylaxis with  Lovenox.  It was recognized she had been on a birth control pill prior to  admission, and the increased risk of venous thromboembolic disease in the  context of birth control pills and her obesity was explained to her so she  could discuss it with her GYN.  She was already enrolled to attend a  bariatric surgery introductory class.  She received dietary counseling in  weight-loss diet.  Efforts were made to get her to accommodate CPAP or  BiPAP, but she could tolerate it only briefly.  She remained hypoxic on room  air, ambulating in the hallway at the end of her hospital stay with room air  saturations recorded 70 to 80% on March 6.  She was felt to have reached  maximum hospital benefit and  discharged home with final explanation for her  pulmonary edema picture not completely clear with possibility of an atypical  pneumonia or noncardiogenic pulmonary edema.   LABORATORY AND X-RAY DATA:  Doppler leg vein evaluation negative for DVT.   Transthoracic echocardiogram by Dr. Clarene Duke showed overall left ventricular  systolic function normal with left ventricular ejection fraction estimated  55 to 65%, left ventricular wall thickness mildly to moderately increased,  right ventricle mildly dilated, trivial paracardial effusion.   EKG showed normal sinus rhythm.   Initial chest x-ray read as cardiomegaly with bilateral pulmonary edema.  Subsequent films showed improvement in air space disease, diffuse pneumonia  versus edema.   Ventilation perfusion scan:  Low probability.   ABGs on 4-liter nasal cannula on February 28 showed pH 7.36, PCO2 50, PO2  96, bicarbonate 28.  ABG on room air July 04, 2003, showed pH 7.45, PCO2 41,  PO2 62, bicarbonate 28.  Initial WBC 13,600, hemoglobin 8.6, hematocrit 29,  normal platelets, 85% neutrophils with large platelets, polychromasia,  target cells and elliptocytes.  Microcytic indices.  Final WBC on March 6  was 11,400 with hemoglobin 8.1, hematocrit 27.  Sedimentation rate was 68.  D-dimer 1.5.  Initial pro time 14.8, INR 1.2, PTT 28.  Initial chemistries  normal except for glucose 128 and albumin 3.1.  Final glucose 106, BUN 8,  creatinine 0.8.  Liver enzymes normal.  Hemoglobin A1C 7.4 (4.6 to 6.1).  Counseled in diabetic diet.  TSH was normal at 2.164.  Serum iron 14, iron  binding capacity 431 with 3% saturation, ferritin 7, all consistent with  iron-deficiency anemia.  Urinalysis showed greater than 300 protein,  otherwise unremarkable.  Cultures all negative for bacteria.   DISCHARGE PLANS:  1. Leave of absence from work until office followup.  2. Oxygen 1 liter per minute continuously until followup. 3. She is to speak with Dr.  Edward Jolly, her GYN, about birth control pills and     risk of DVT.  4. She is to keep her bariatric surgery class.  5. Weight-loss diet, to avoid starches and carbohydrates.   DISCHARGE MEDICATIONS:  1. Albuterol inhaler 2 puffs four times a day p.r.n.  2. K-Dur 20 mEq b.i.d.  3. Furosemide 40 mg daily.  4. Nu-Iron 150 mg b.i.d.  5. Altace 5 mg daily.  6. Continue allergy vaccine injections.   FOLLOW UP:  Office followup with Dr. [name] in one week and with Dr. Eloise Harman  and Dr. Edward Jolly as planned.                                                Clinton D. [name], M.D.    CDY/MEDQ  D:  08/02/2003  T:  08/04/2003  Job:  [id number]   cc:   Madaline Savage, M.D.  [address], [address]  [place]  [place] [address]  Fax: [phone]   Bernette Redbird, M.D.  [address]  [place], [place] [address]  Fax: [phone]   Barry Dienes. Eloise Harman, M.D.  [address]  [place]  [place] [address]  Fax: [phone]   Randye Lobo, M.D.  [address], [address]  [place]  [place] [address]  Fax: [phone]

## 2010-09-18 NOTE — Assessment & Plan Note (Signed)
 HEALTHCARE                               PULMONARY OFFICE NOTE   NAME:Musick, FIA HEBERT                      MRN:          [id number]  DATE:11/16/2005                            DOB:          [birth year]    CHIEF COMPLAINT:  Wheezing x 3 days.   HISTORY OF PRESENT ILLNESS:  Ms. Laquinta Hazell is a morbidly obese, greater  350 lbs., African American female, who presents with three days of  increasing shortness of breath without fevers, chills, sweat or sputum  production.  Notes she continues to eat late at night and noted she wakes up  coughing.  She continues to have this problems intermittently.  She presents  today with a question of whether or not she needs home nebulizers.   PHYSICAL EXAMINATION:  GENERAL:  Morbidly obese African American female, who  has a hoarse voice and obvious increased pursed lip breathing.  HEENT:  Short neck.  No JVD.  CHEST:  Proximal wheezes that clear with pursed lip breathing.  CARDIAC:  Heart sounds are regular, regular rate rhythm.  ABDOMEN:  Obese, soft and non-tender.  GU AND RECTAL:  Deferred.  EXTREMITIES:  Warm with mild edema.  VITAL SIGNS:  BLOOD PRESSURE:  132/92.  PULSE:  90.  O2 SATS:  97% on room  air.   IMPRESSION AND PLAN:  1.  Vocal cord disruption most likely with a gastroesophageal reflux      trigger.  She does have a hoarse voice that clears with vocal exercise,      consisting of pursed lip breathing and formation of the alphabet.  She      does have an anxiety and depression component.  She was given extensive      instructions on proper pursed lip breathing and formation of the ABCs.      She is also being treated with one months' supply of proton pump      inhibitors.  I have asked her to follow-up in three weeks with myself or      the nurse practitioner, Anders Simmonds.  2.  Morbid obesity. She has been instructed in weight loss.  This is tending      to be an ongoing problem.  3.   Obstructive sleep apnea.  She is on a C-PAP at 10 cm, per Dr. [name],      which is working well.  4.  Allergic rhinitis and asthma.  She is followed by Dr. Jetty Duhamel for      this.  Will continue to follow-up.                                   Devra Dopp, MSN, ACNP   SM/MedQ  DD:  11/16/2005  DT:  11/17/2005  Job #:  [id number]

## 2010-09-18 NOTE — Consult Note (Signed)
NAME:  [name] [name], [name] [name]                         ACCOUNT NO.:  [id number]   MEDICAL RECORD NO.:  [id number]                   PATIENT TYPE:  INP   LOCATION:  2901                                 FACILITY:  [facility]   PHYSICIAN:  Bernette Redbird, M.D.                DATE OF BIRTH:  [birth year]   DATE OF CONSULTATION:  07/03/2003  DATE OF DISCHARGE:                                   CONSULTATION   Dr. [name] asked [place] to see this 40 year old female because of possible rectal  bleeding.   [name] [name] has no prior history of rectal bleeding or other GI problems.  She  was admitted to the hospital a couple of days ago because of progressive  dyspnea.  She has a history of asthma and morbid obesity.   She is due for her menstrual period and has a history of uterine fibroids.  Earlier today, she was noted to have roughly 100 mL of fairly fresh blood on  her pad.  It was unclear whether or not it was of menstrual or rectal  origin.  She subsequently had a few small streaks and then sat on the  commode and there was quite a bit of both blood and stool present.  The  patient denies any abdominal pain.  It was not really possible to tell  whether the blood was separate from the stool in its origin or not.  In view  of this, we were asked to see the patient to help sort things out.   ALLERGIES:  No known drug allergies.   OUTPATIENT MEDICATIONS:  1. Advair.  2. Albuterol.  3. Medrol.  4. She did receive one dose of Lovenox after being admitted to the hospital.   PAST MEDICAL HISTORY:  (Obtained from review of old chart)  1. Morbid obesity, contemplating gastric bypass surgery.  She has apparently     gained approximately 200 pounds over the past seven or eight years.  2. There is possible history of sleep apnea.  3. She has no history of coronary disease, chronic lung disease, previous     pulmonary embolism or diabetes.   PAST SURGICAL HISTORY:  None other than removal of uterine fibroids in  November of 2004.   HABITS:  Nonsmoker and nondrinker.   FAMILY HISTORY:  Negative for GI tract illnesses.   SOCIAL HISTORY:  Divorced, originally from [place], [place].  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   REVIEW OF SYMPTOMS:  The patient states she has no problem with constipation  nor any history of previous rectal bleeding.  As noted, she is approximately  due for a menstrual cycle at this time, since it has been almost exactly  four weeks since her last period.   PHYSICAL EXAMINATION:  GENERAL APPEARANCE:  [name] [name] is a pleasant,  articulate, African American female who is morbidly obese.  HEENT:  She is anicteric and without  frank pallor.  LUNGS:  Breath sounds are hard to hear.  I do not hear any high pitched  wheezes or significant rhonchi,, just some soft low pitched rhonchi.  CARDIOVASCULAR:  Heart sounds are regular but somewhat distant, no murmurs  appreciated, no gallops heard.  ABDOMEN:  Morbidly obese but not tender and, as noted above, no history of  abdominal pain in association with this bleeding.   IMPRESSION:  Probable uterine/menstrual bleeding, although a hemorrhoidal  bleed would also be possible.  There is no evidence of clinically  significant hemorrhage.  Note that the patient's admission hemoglobin of 8.6  was basically stable when rechecked today at 8.9.  She is, however,  microcytic consistent with iron-deficiency anemia.  She had an RDW on  admission of 23 and a platelet count of 556.   RECOMMENDATIONS:  1. Bedside sigmoidoscopic evaluation to help discern whether or nontender     the bleeding is of rectal origin.  2. Check iron studies to confirm current clinical impression of iron-     deficiency as the source of her anemia.  3. The patient will be a very poor candidate for evaluation of the GI tract,     given her morbid obesity, but it would probably be reasonable to check     home Hemoccults at the time when she is not  menstruating and if they are     consistently negative, probably no further work-up of the GI tract would     be warranted.                                               Bernette Redbird, M.D.    RB/MEDQ  D:  07/03/2003  T:  07/04/2003  Job:  [id number]   cc:   Madaline Savage, M.D.  [address]  [place]  [place] [address]  Fax: [phone]

## 2010-09-18 NOTE — Assessment & Plan Note (Signed)
[facility]                               PULMONARY OFFICE NOTE   NAME:[name] [name], [name] [name]                      MRN:          [id number]  DATE:01/17/2006                            DOB:          Apr 03, 1971    HISTORY OF PRESENT ILLNESS:  Patient is a 40 year old African-American  female patient of Dr. Roxy Cedar who has a known history of allergic rhinitis,  asthma and obstructive sleep apnea presents for a 1 week history of  productive cough with thick yellow sputum, wheezing and shortness of breath.  She denies any hemoptysis, chest pain, orthopnea, PND or increased leg  swelling.   CURRENT MEDICATIONS:  Are correct.   PHYSICAL EXAMINATION:  Patient is a pleasant morbidly obese female in no  acute distress.  She is afebrile with stable vital signs.  02 saturation is 96% on room air.  HEENT:  Unremarkable.  NECK:  Supple with no adenopathy.  LUNGS:  Lung sounds reveal a few expiratory wheezes bilaterally.  CARDIAC:  Regular rate.  ABDOMEN:  Soft, non-tender.   IMPRESSION/PLAN:  1. Acute exacerbation of asthmatic bronchitis.  The patient is to begin a      Z pack.  Mucinex DM twice daily.  Prednisone taper over the next 6      days.  2. Patient is to return with Dr. [name] in one month or sooner if needed.                                   Rubye Oaks, NP                                Clinton D. [name], MD, [facility], FACP   TP/MedQ  DD:  01/17/2006  DT:  01/18/2006  Job #:  [id number]

## 2010-09-18 NOTE — Assessment & Plan Note (Signed)
[facility]                             PULMONARY OFFICE NOTE   NAME:[name] [name], [name] [name]                      MRN:          [id number]  DATE:08/01/2006                            DOB:          1971/02/18    HISTORY OF PRESENT ILLNESS:  The patient is a 40 year old African-  American female patient of Dr. [name] who has a known history of asthma,  morbid obesity, obstructive sleep apnea, allergic rhinitis, who presents  for a followup.  The patient was recently seen on July 29, 2006 at the  emergency room for an asthmatic flare.  The patient was given a  prednisone taper and nebulizer treatment.  The patient reports symptoms  have improved.  However, the patient did have a chest x-ray, which had  some vascular congestion in the bases.  The patient denied any increased  lower extremity swelling.  The patient did have some worsening shortness  of breath, but also had wheezing.   PAST MEDICAL HISTORY:  Reviewed.   CURRENT MEDICATIONS:  Reviewed.   PHYSICAL EXAM:  The patient is a morbidly obese female, greater than 400  pounds in no acute distress.  She is afebrile with stable vital signs.  O2 saturation is 96% on room  air.  HEENT:  Unremarkable.  NECK:  Supple without adenopathy.  No JVD.  LUNGS:  Sounds are diminished in the bases.  No wheezing is noted.  CARDIAC:  Regular rate.  ABDOMEN:  Morbidly obese, soft, with a large panniculus.  EXTREMITIES:  Warm without any calf tenderness, cyanosis, clubbing.  There is trace edema with venous insufficiency changes.   ASSESSMENT:  1. Recent asthmatic flare.  The patient is to taper prednisone as      scheduled.  Continue on her present regimen and follow back up with      Dr. [name] in 2 weeks or sooner if needed.  2. Questionable some fluid overload recently on chest x-ray.  Suspect      this is just some      atelectasis secondary to the patient's size.  Will compare old      films for comparison and  discuss with Dr. [name].     Rubye Oaks, NP  Electronically Signed      Clinton D. [name], MD, [facility], FACP  Electronically Signed   TP/MedQ  DD: 08/03/2006  DT: 08/03/2006  Job #: [id number]

## 2010-09-18 NOTE — Assessment & Plan Note (Signed)
[facility]                               PULMONARY OFFICE NOTE   NAME:[name] [name]                      MRN:          [id number]  DATE:02/23/2006                            DOB:          [birth year]    PROBLEM:  1. Asthma.  2. Obstructive sleep apnea.  3. Morbid obesity.  4. Allergic rhinitis.   HISTORY:  Since I last saw her in May, she has been seen here twice by the  nurse practitioners for wheezing, exacerbations of dyspnea, treated as  asthmatic bronchitis. She is worked in again today for the same complaint.  She is working two jobs. She says she was breathing and walking better after  last year in September until last week, she started a familiar pattern of  wheezing, dyspnea again. She has recognized no sore throat, fever, chest  pain, or palpitations. She is not sure if she is retained fluid or not and  for lack of a better guess, she blames weather change. She is coughing more.  She was told she is too heavy for the bariatric surgery program here and she  is looking for alternatives that would take her.   MEDICATIONS:  1. CPAP at 10 CWP.  2. Advair 100/50 one puff b.i.d.  3. Protonix 40 mg.  4. Vitamin C.  5. Albuterol inhaler.  6. Allegra 180.  7. Mucinex.   ALLERGIES:  No medication allergy.   OBJECTIVE:  Is well over 350 pounds, over the limit of our scale. Blood  pressure: 132/84.  Pulse: Regular, 90.  Room air saturation: 93%. She is  sadly overweight.  Breath sounds are shallow, consistent with body habitus and there is faint  diffuse wheeze.  There may be trace neck vein distention, hard to tell. There is no stridor  or obvious pharyngeal erythema or nasal congestion now.  Heart sounds are muffled and regular without murmur.  She does not seem to have edema in her feet and Homan's is negative.   IMPRESSION:  Acute exacerbation of asthma, possibly with a fluid retention  component. The primary problem is morbid  obesity.   PLAN:  1. Flu vaccine.  2. Nebulizer treatment, Xopenex 1.25 mg.  3. Take furosemide 40 mg daily x 2 days, then p.r.n.; noting that she has      it home, but had not      been taking it.  4. Schedule return six weeks, earlier p.r.n.       Clinton D. [name], MD, FCCP, FACP      CDY/MedQ  DD:  02/23/2006  DT:  02/24/2006  Job #:  [id number]

## 2010-09-18 NOTE — Op Note (Signed)
NAME:  [name] [name], [name] [name]                         ACCOUNT NO.:  [id number]   MEDICAL RECORD NO.:  [id number]                   PATIENT TYPE:  AMB   LOCATION:  SDC                                  FACILITY:  [facility]   PHYSICIAN:  Randye Lobo, M.D.                DATE OF BIRTH:  November 26, 1970   DATE OF PROCEDURE:  03/22/2003  DATE OF DISCHARGE:                                 OPERATIVE REPORT   PREOPERATIVE DIAGNOSES:  1. Prolapsing cervical fibroid.  2. Anemia.   POSTOPERATIVE DIAGNOSES:  1. Prolapsing cervical fibroid.  2. Anemia.   PROCEDURE:  1. Cervical myomectomy  2. Dilation and curettage.   SURGEON:  Randye Lobo, M.D.   ANESTHESIA:  Spinal.   FLUIDS REPLACED:  400 mL Ringer's lactate.   URINE OUTPUT:  50 mL.   ESTIMATED BLOOD LOSS:  Minimal.   COMPLICATIONS:  None.   INDICATION FOR PROCEDURE:  The patient is a 40 year old gravida 0 African-  American female with menorrhagia and cramping.  The patient was seen for  abnormal menstruation and was placed on oral contraceptives with no  response.  The patient was examined in my office and was found to have a  normal cervix at that time.  The bimanual exam was limited due to the  patient's obesity status.  An ultrasound performed on March 18, 2003,  documented a uterine size of 9.2 x 4.2 x 3.8 cm with several small fibroids  up to 14 mm present.  The endometrial stripe was difficult to see but was  thought to be 10 mm.  There was a question of a calcified mass approximately  3 cm in diameter in the right adnexal region.  It was uncertain if this  represented a fibroid or if it represented an ovarian mass.  The patient at  that time was found to be slightly anemic and was started on iron therapy  t.i.d.  The patient was seen in the office again on March 20, 2003, and  she had significant increase in her bleeding over the last 48 hours.  She  was diagnosed with a prolapsing 3 cm cervical fibroid.  Her hemoglobin  measured 7.5 at that time.  Plans were made at that time to proceed with a  cervical myomectomy after the risks, benefits, and alternatives were  discussed with her.   FINDINGS:  Examination under anesthesia revealed a 3 cm prolapsing cervical  fibroid on an extremely thin stalk.  The cervix itself was dilated but  otherwise normal.  A moderate amount of endometrial curettings was obtained  and sent to pathology.   SPECIMENS:  The cervical fibroid was sent to pathology separately from the  endometrial curettings.   DESCRIPTION OF PROCEDURE:  With an IV in place, the patient was taken to the  operating room after she was properly identified.  The patient did receive  Ancef 1 g intravenously for  antibiotic prophylaxis.  In the operating room a  spinal anesthetic was administered and the patient was then placed in the  dorsal lithotomy position.  The vagina and perineum were then sterilely  prepped and the bladder was sterilely catheterized of urine.  She was then  sterilely draped.   An examination under anesthesia was performed and the findings are as noted  above.  A single-tooth tenaculum was placed on the prolapsing cervical  fibroid, and a long Kelly clamp was placed across the thin stalk.  This was  sharply excised and was sent to pathology.  Before any suture could be  placed on the stalk or cautery could be performed, the pedicle site released  from the clamp.  There was no active bleeding noted at this time.  The  cervix was then dilated to #27 [facility] dilator.  The endometrium was curetted  first with a smooth and then with a serrated curette such that all four  quadrants of the uterus had a gritty texture to them.  The endometrial  curettings were sent to pathology.  The operative site was examined at this  time, and hemostasis was noted to be excellent.  The procedure was therefore  concluded.  The patient was cleansed of any remaining Betadine.  She was  taken out of the  dorsal lithotomy position, and she was escorted to the  recovery room in stable and awake condition.  There were no complications to  the procedure.  All needle, instrument, and sponge counts were correct.                                               Randye Lobo, M.D.    BES/MEDQ  D:  03/22/2003  T:  03/22/2003  Job:  [id number]

## 2010-09-18 NOTE — Op Note (Signed)
NAME:  [name] [name], [name] [name]                         ACCOUNT NO.:  [id number]   MEDICAL RECORD NO.:  [id number]                   PATIENT TYPE:  INP   LOCATION:  2901                                 FACILITY:  [facility]   PHYSICIAN:  Bernette Redbird, M.D.                DATE OF BIRTH:  [birth year]   DATE OF PROCEDURE:  07/03/2003  DATE OF DISCHARGE:                                 OPERATIVE REPORT   PROCEDURE:  Flexible sigmoidoscopy.   ENDOSCOPIST:  Bernette Redbird, M.D.   INDICATIONS:  This is a 40 year old female admitted to the hospital with  breathing difficulties a couple of days ago.  She is morbidly obese and was  placed on Lovenox prophylaxis.  She started having passage of blood from the  peroneal area, unclear whether of vaginal or rectal origin although when she  had a bowel movement, there was blood in the commode raising the question of  rectal bleeding.   FINDINGS:  Normal exam to 25 cm with formed, brown stool present and no  blood or source of bleeding identified.   DESCRIPTION OF PROCEDURE:  The nature, purpose, and risks of the procedure  have been discussed with the patient who provided written consent.  The  procedure was done at the bedside in her hospital room without any IV  sedation and without any prep being administered.  Perianal exam did not  demonstrate any fissures, fistulae, prolapsed hemorrhoids, or other  pathology.  There did appear to be some fresh mucoid blood anteriorly as  might be arising from the vaginal area.  I did not do a bimanual exam.  Digital exam showed normal anal sphincter tone, perhaps slightly increased.  The Olympus pediatric video colonoscope was advanced to about 25 mL without  difficulty, encountering a generous amount of formed brown stool (no  impaction).  No blood was present in the rectosigmoid lumen.  The stool was  not tinged with any blood or maroon residue.  No source of bleeding was  identified, specifically, I did not  encounter any diverticular disease,  colitis, proctitis, vascular ectasis, polyps or masses.  Retroflexion in the  rectum was normal, and pullout through the anal canal really did not  demonstrate any significant internal hemorrhoids.  No biopsies were  obtained.  The patient tolerated the procedure well.   IMPRESSION:  1. No source of bleeding identified on this exam.  2. By exclusion, blood was almost certainly of vaginal origin (menstrual).   PLAN:  We will plan to send the patient a set of home Hemoccults as added  reassurance that her microcytic anemia is now occurring from chronic GI  blood loss (doubt) .  Bernette Redbird, M.D.    RB/MEDQ  D:  07/03/2003  T:  07/04/2003  Job:  [id number]   cc:   Madaline Savage, M.D.  [address]  [place]  [place] [address]  Fax: [phone]   Clinton D. Young, M.D.  [address] [place]  [place] [address]  Fax: [phone]

## 2010-11-06 ENCOUNTER — Ambulatory Visit: Payer: Self-pay | Admitting: Internal Medicine

## 2010-11-24 ENCOUNTER — Encounter: Payer: Self-pay | Admitting: Internal Medicine

## 2010-11-26 ENCOUNTER — Encounter: Payer: Self-pay | Admitting: Internal Medicine

## 2010-11-26 ENCOUNTER — Ambulatory Visit (INDEPENDENT_AMBULATORY_CARE_PROVIDER_SITE_OTHER): Payer: Managed Care, Other (non HMO) | Admitting: Internal Medicine

## 2010-11-26 VITALS — BP 102/82 | HR 88 | Ht 64.0 in | Wt 299.4 lb

## 2010-11-26 DIAGNOSIS — J45909 Unspecified asthma, uncomplicated: Secondary | ICD-10-CM

## 2010-11-26 DIAGNOSIS — E678 Other specified hyperalimentation: Secondary | ICD-10-CM

## 2010-11-26 DIAGNOSIS — G4733 Obstructive sleep apnea (adult) (pediatric): Secondary | ICD-10-CM

## 2010-11-26 MED ORDER — BENZONATATE 200 MG PO CAPS: 200.0000 mg | ORAL_CAPSULE | Freq: Three times a day (TID) | ORAL | Status: AC | PRN

## 2010-11-26 NOTE — Assessment & Plan Note (Signed)
Recent onset tracheobronchitis, nonspecific. We will try Symbicort and tessalon

## 2010-11-26 NOTE — Patient Instructions (Signed)
Script benzonatate for cough as needed  Sample Symbicort 160    2 puffs then rinse mouth well, twice daily  Please call as needed

## 2010-11-26 NOTE — Assessment & Plan Note (Signed)
Good compliance and control. Unfortunately she has regained considerable weight and is encouraged to get that turned around while she can.

## 2010-11-26 NOTE — Progress Notes (Signed)
Subjective:    Patient ID: [name] [name], female    DOB: 1971/01/13, 40 y.o.   MRN: [id number]  HPI 11/26/10- 9 yo never smoker followed for OSA, asthma, obesity/hypoventilation (previous bariatric surgery), complicated by relapsing obesity Last here May 07, 2010 Says she has gained weight, but today is "my first day back on track" to lose it again.  CPAP 9,  likes new machine. Able to use it all night every night.  New cough began 2 weeks ago without obvious trigger. She is in draft at work. Denies nose or sinus trouble.  Denies reflux or nocturnal choking.  Notices some dyspnea walking in heat.   Review of Systems Constitutional:   No-   weight loss, night sweats, fevers, chills, fatigue, lassitude. HEENT:   No-   headaches, difficulty swallowing, tooth/dental problems, sore throat,                  No-   sneezing, itching, ear ache, nasal congestion, post nasal drip,   CV:  No-   chest pain, orthopnea, PND, swelling in lower extremities, anasarca, dizziness, palpitations  GI:  No-   heartburn, indigestion, abdominal pain, nausea, vomiting, diarrhea,                 change in bowel habits, loss of appetite  Resp: No- acute shortness of breath with exertion or at rest.  No-  excess mucus,             No-   productive cough,  No non-productive cough,  No-  coughing up of blood.              No-   change in color of mucus.  No- wheezing.    Skin: No-   rash or lesions.  GU: No-   dysuria, change in color of urine, no urgency or frequency.  No- flank pain.  MS:  No-   joint pain or swelling.  No- decreased range of motion.  No- back pain.  Psych:  No- change in mood or affect. No depression or anxiety.  No memory loss.      Objective:   Physical Exam General- Alert, Oriented, Affect-appropriate, Distress- none acute   obese Skin- rash-none, lesions- none, excoriation- none Lymphadenopathy- none Head- atraumatic            Eyes- Gross vision intact, PERRLA, conjunctivae  clear secretions            Ears- Hearing, canals normal            Nose- Clear, No- Septal dev, mucus, polyps, erosion, perforation             Throat- Mallampati II-III , mucosa clear , drainage- none, tonsils- atrophic Neck- flexible , trachea midline, no stridor , thyroid nl, carotid no bruit Chest - symmetrical excursion , unlabored           Heart/CV- RRR , no murmur , no gallop  , no rub, nl s1 s2                           - JVD- none , edema- none, stasis changes- none, varices- none           Lung- clear to P&A, wheeze- none,  dullness-none, rub- none     +Loose occasional upper airway -type cough           Chest wall-  Abd- tender-no, distended-no, bowel sounds-present, HSM-  no Br/ Gen/ Rectal- Not done, not indicated Extrem- cyanosis- none, clubbing, none, atrophy- none, strength- nl Neuro- grossly intact to observation         Assessment & Plan:

## 2010-11-28 NOTE — Assessment & Plan Note (Signed)
Unfortunately she is at risk of wiping out the gains she made after bariatric surgery. Encouraged to get back onto her previous program.

## 2011-01-28 LAB — CBC
HCT: 31.2 — ABNORMAL LOW
Platelets: 632 — ABNORMAL HIGH
RDW: 19.3 — ABNORMAL HIGH
WBC: 12.8 — ABNORMAL HIGH

## 2011-01-28 LAB — GC/CHLAMYDIA PROBE AMP, GENITAL
Chlamydia, DNA Probe: NEGATIVE
GC Probe Amp, Genital: NEGATIVE

## 2011-01-28 LAB — SAMPLE TO BLOOD BANK

## 2011-01-28 LAB — WET PREP, GENITAL
Clue Cells Wet Prep HPF POC: NONE SEEN
Trich, Wet Prep: NONE SEEN
Yeast Wet Prep HPF POC: NONE SEEN

## 2011-02-05 DIAGNOSIS — Z90710 Acquired absence of both cervix and uterus: Secondary | ICD-10-CM | POA: Insufficient documentation

## 2011-04-02 ENCOUNTER — Other Ambulatory Visit: Payer: Self-pay | Admitting: Internal Medicine

## 2011-05-10 DIAGNOSIS — R632 Polyphagia: Secondary | ICD-10-CM | POA: Insufficient documentation

## 2011-05-10 DIAGNOSIS — D509 Iron deficiency anemia, unspecified: Secondary | ICD-10-CM | POA: Insufficient documentation

## 2011-05-13 ENCOUNTER — Ambulatory Visit (INDEPENDENT_AMBULATORY_CARE_PROVIDER_SITE_OTHER): Payer: Managed Care, Other (non HMO) | Admitting: Internal Medicine

## 2011-05-13 ENCOUNTER — Encounter: Payer: Self-pay | Admitting: Internal Medicine

## 2011-05-13 VITALS — BP 112/80 | HR 76 | Ht 64.0 in | Wt 294.2 lb

## 2011-05-13 DIAGNOSIS — J45909 Unspecified asthma, uncomplicated: Secondary | ICD-10-CM

## 2011-05-13 DIAGNOSIS — G4733 Obstructive sleep apnea (adult) (pediatric): Secondary | ICD-10-CM

## 2011-05-13 DIAGNOSIS — E678 Other specified hyperalimentation: Secondary | ICD-10-CM

## 2011-05-13 NOTE — Patient Instructions (Signed)
Clear from pulmonary standpoint for proposed surgery  Take your CPAP machine  9 cwp/. [facility], as instructed  Please call as needed

## 2011-05-13 NOTE — Progress Notes (Signed)
Patient ID: SELENE PELTZER, female    DOB: 18-Feb-1971, 41 y.o.   MRN: [id number]  HPI 11/26/10- 19 yo never smoker followed for OSA, asthma, obesity/hypoventilation (previous bariatric surgery), complicated by relapsing obesity Last here May 07, 2010 Says she has gained weight, but today is "my first day back on track" to lose it again.  CPAP 9,  likes new machine. Able to use it all night every night.  New cough began 2 weeks ago without obvious trigger. She is in draft at work. Denies nose or sinus trouble.  Denies reflux or nocturnal choking.  Notices some dyspnea walking in heat.   05/13/11- 42 yo never smoker followed for OSA, asthma, obesity/hypoventilation (previous bariatric surgery), complicated by relapsing obesity She has put off getting flu vaccine pending physical exam with Dr. Allyne Gee next week. I explained that with the early flu season this year, it is good to get it much earlier in the fall. She is pending surgery at [facility] to reduce her pannus. She still uses CPAP at 9 CWP/[facility] all night every night. She has had no asthma exacerbations and no recent respiratory infection or acute concern. She feels well. Uses occasional Claritin but is not needing asthma medications at all.  Review of Systems Constitutional:   No-   weight loss, night sweats, fevers, chills, fatigue, lassitude. HEENT:   No-  headaches, difficulty swallowing, tooth/dental problems, sore throat,       Occasional  sneezing, itching, ear ache, nasal congestion, post nasal drip,  CV:  No-   chest pain, orthopnea, PND, swelling in lower extremities, anasarca, dizziness, palpitations Resp: No- acute  shortness of breath with exertion or at rest.              No-   productive cough,  No non-productive cough,  No- coughing up of blood.              No-   change in color of mucus.  No- wheezing.   Skin: No-   rash or lesions. GI:  No-   heartburn, indigestion, abdominal pain, nausea, vomiting, diarrhea,               change in bowel habits, loss of appetite GU:  MS:  No-   joint pain or swelling.  No- decreased range of motion.  No- back pain. Neuro-     nothing unusual Psych:  No- change in mood or affect. No depression or anxiety.  No memory loss.      Objective:   Physical Exam General- Alert, Oriented, Affect-appropriate, Distress- none acute,  Morbidly obese Skin- rash-none, lesions- none, excoriation- none Lymphadenopathy- none Head- atraumatic            Eyes- Gross vision intact, PERRLA, conjunctivae clear secretions            Ears- Hearing, canals normal            Nose- Clear, No- Septal dev, mucus, polyps, erosion, perforation             Throat- Mallampati II-III , mucosa clear , drainage- none, tonsils- atrophic Neck- flexible , trachea midline, no stridor , thyroid nl, carotid no bruit Chest - symmetrical excursion , unlabored           Heart/CV- RRR , no murmur , no gallop  , no rub, nl s1 s2                           -  JVD- none , edema- none, stasis changes- none, varices- none           Lung- clear to P&A, wheeze- none,  dullness-none, rub- none                Chest wall-  Abd- Br/ Gen/ Rectal- Not done, not indicated Extrem- cyanosis- none, clubbing, none, atrophy- none, strength- nl Neuro- grossly intact to observation

## 2011-05-15 NOTE — Assessment & Plan Note (Signed)
Currently very well controlled without medications and with no intervention needed. I don't expect this to be a big problem at the time of surgery.

## 2011-05-15 NOTE — Assessment & Plan Note (Signed)
She continues good compliance and control. Weight loss would help.

## 2011-05-15 NOTE — Assessment & Plan Note (Signed)
Increased surgical risk related to hypoventilation, impaired clearance of secretions and the associated morbidities of severe obesity. I have no intervention to offer now that is likely to change this. She will need attention including early mobilization if possible after surgery.

## 2011-06-03 DIAGNOSIS — M793 Panniculitis, unspecified: Secondary | ICD-10-CM | POA: Insufficient documentation

## 2011-07-20 ENCOUNTER — Other Ambulatory Visit: Payer: Self-pay | Admitting: Internal Medicine

## 2011-11-26 ENCOUNTER — Ambulatory Visit: Payer: Managed Care, Other (non HMO) | Admitting: Internal Medicine

## 2012-05-11 ENCOUNTER — Ambulatory Visit (INDEPENDENT_AMBULATORY_CARE_PROVIDER_SITE_OTHER): Payer: Managed Care, Other (non HMO) | Admitting: Internal Medicine

## 2012-05-11 ENCOUNTER — Ambulatory Visit: Payer: Managed Care, Other (non HMO) | Admitting: Internal Medicine

## 2012-05-11 ENCOUNTER — Encounter: Payer: Self-pay | Admitting: Internal Medicine

## 2012-05-11 VITALS — BP 124/90 | HR 80 | Ht 65.0 in | Wt 298.4 lb

## 2012-05-11 DIAGNOSIS — G4733 Obstructive sleep apnea (adult) (pediatric): Secondary | ICD-10-CM

## 2012-05-11 DIAGNOSIS — E678 Other specified hyperalimentation: Secondary | ICD-10-CM

## 2012-05-11 NOTE — Progress Notes (Signed)
Patient ID: [name] [name], female    DOB: August 21, 1970, 42 y.o.   MRN: [id number]  HPI 11/26/10- 42 yo never smoker followed for OSA, asthma, obesity/hypoventilation (previous bariatric surgery), complicated by relapsing obesity Last here May 07, 2010 Says she has gained weight, but today is "my first day back on track" to lose it again.  CPAP 9,  likes new machine. Able to use it all night every night.  New cough began 2 weeks ago without obvious trigger. She is in draft at work. Denies nose or sinus trouble.  Denies reflux or nocturnal choking.  Notices some dyspnea walking in heat.   05/13/11- 42 yo never smoker followed for OSA, asthma, obesity/hypoventilation (previous bariatric surgery), complicated by relapsing obesity She has put off getting flu vaccine pending physical exam with Dr. Allyne Gee next week. I explained that with the early flu season this year, it is good to get it much earlier in the fall. She is pending surgery at [facility] to reduce her pannus. She still uses CPAP at 9 CWP/[facility] all night every night. She has had no asthma exacerbations and no recent respiratory infection or acute concern. She feels well. Uses occasional Claritin but is not needing asthma medications at all.  05/11/12  42 yo never smoker followed for OSA, asthma, obesity/hypoventilation (previous gastric bypass bariatric surgery), complicated by relapsing obesity. FOLLOWS FOR: wearing CPAP 9/ [facility] approx. 4-5 hrs per night, tolerating pressures ok --denies any new concerns at this time. She likes her fullface mask. She has gained 18 pounds over the last year or 2.  Review of Systems Constitutional:   No-   weight loss, night sweats, fevers, chills, fatigue, lassitude. HEENT:   No-  headaches, difficulty swallowing, tooth/dental problems, sore throat,       Occasional  sneezing, itching, ear ache, nasal congestion, post nasal drip,  CV:  No-   chest pain, orthopnea, PND, swelling in lower  extremities, anasarca, dizziness, palpitations Resp: No- acute  shortness of breath with exertion or at rest.              No-   productive cough,  No non-productive cough,  No- coughing up of blood.              No-   change in color of mucus.  No- wheezing.   Skin: No-   rash or lesions. GI:  No-   heartburn, indigestion, abdominal pain, nausea, vomiting,  GU:  MS:  No-   joint pain or swelling.   Neuro-     nothing unusual Psych:  No- change in mood or affect. No depression or anxiety.  No memory loss.    Objective:   Physical Exam General- Alert, Oriented, Affect-appropriate, Distress- none acute,  Morbidly obese Skin- rash-none, lesions- none, excoriation- none Lymphadenopathy- none Head- atraumatic            Eyes- Gross vision intact, PERRLA, conjunctivae clear secretions            Ears- Hearing, canals normal            Nose- Clear, No- Septal dev, mucus, polyps, erosion, perforation             Throat- Mallampati II-III , mucosa clear , drainage- none, tonsils- atrophic Neck- flexible , trachea midline, no stridor , thyroid nl, carotid no bruit Chest - symmetrical excursion , unlabored           Heart/CV- RRR , no murmur , no gallop  ,  no rub, nl s1 s2                           - JVD- none , edema- none, stasis changes- none, varices- none           Lung- clear to P&A, wheeze- none,  dullness-none, rub- none                Chest wall-  Abd- Br/ Gen/ Rectal- Not done, not indicated Extrem- cyanosis- none, clubbing, none, atrophy- none, strength- nl Neuro- grossly intact to observation

## 2012-05-11 NOTE — Patient Instructions (Addendum)
We can continue CPAP 9/ [facility] - you are doing great with this [name] [name]- keep up the good work!

## 2012-05-21 NOTE — Assessment & Plan Note (Signed)
Good compliance and control with CPAP. Pressure still seems adequate despite her weight gain so far we discussed this and may need to adjust pressure in the future.

## 2012-05-21 NOTE — Assessment & Plan Note (Addendum)
She is oxygenating well enough now at rest but her body habitus predisposes to hypo-ventilation

## 2012-05-21 NOTE — Assessment & Plan Note (Signed)
She has regained 18 pounds in the last year or 2. She is aware of it. She may benefit from referral to the nutritional/counseling side of the bariatric program.

## 2013-05-09 ENCOUNTER — Ambulatory Visit (INDEPENDENT_AMBULATORY_CARE_PROVIDER_SITE_OTHER): Payer: Managed Care, Other (non HMO)

## 2013-05-09 VITALS — BP 129/84 | HR 93 | Resp 24 | Ht 65.0 in | Wt 287.0 lb

## 2013-05-09 DIAGNOSIS — B353 Tinea pedis: Secondary | ICD-10-CM

## 2013-05-09 MED ORDER — CLOTRIMAZOLE 1 % EX CREA: 1.0000 "application " | TOPICAL_CREAM | Freq: Two times a day (BID) | CUTANEOUS | Status: DC

## 2013-05-09 NOTE — Patient Instructions (Signed)
Athlete's Foot Athlete's foot (tinea pedis) is a fungal infection of the skin on the feet. It often occurs on the skin between the toes or underneath the toes. It can also occur on the soles of the feet. Athlete's foot is more likely to occur in hot, humid weather. Not washing your feet or changing your socks often enough can contribute to athlete's foot. The infection can spread from person to person (contagious). CAUSES Athlete's foot is caused by a fungus. This fungus thrives in warm, moist places. Most people get athlete's foot by sharing shower stalls, towels, and wet floors with an infected person. People with weakened immune systems, including those with diabetes, may be more likely to get athlete's foot. SYMPTOMS   Itchy areas between the toes or on the soles of the feet.  White, flaky, or scaly areas between the toes or on the soles of the feet.  Tiny, intensely itchy blisters between the toes or on the soles of the feet.  Tiny cuts on the skin. These cuts can develop a bacterial infection.  Thick or discolored toenails. DIAGNOSIS  Your caregiver can usually tell what the problem is by doing a physical exam. Your caregiver may also take a skin sample from the rash area. The skin sample may be examined under a microscope, or it may be tested to see if fungus will grow in the sample. A sample may also be taken from your toenail for testing. TREATMENT  Over-the-counter and prescription medicines can be used to kill the fungus. These medicines are available as powders or creams. Your caregiver can suggest medicines for you. Fungal infections respond slowly to treatment. You may need to continue using your medicine for several weeks. PREVENTION   Do not share towels.  Wear sandals in wet areas, such as shared locker rooms and shared showers.  Keep your feet dry. Wear shoes that allow air to circulate. Wear cotton or wool socks. HOME CARE INSTRUCTIONS   Take medicines as directed by  your caregiver. Do not use steroid creams on athlete's foot.  Keep your feet clean and cool. Wash your feet daily and dry them thoroughly, especially between your toes.  Change your socks every day. Wear cotton or wool socks. In hot climates, you may need to change your socks 2 to 3 times per day.  Wear sandals or canvas tennis shoes with good air circulation.  If you have blisters, soak your feet in Burow's solution or Epsom salts for 20 to 30 minutes, 2 times a day to dry out the blisters. Make sure you dry your feet thoroughly afterward. SEEK MEDICAL CARE IF:   You have a fever.  You have swelling, soreness, warmth, or redness in your foot.  You are not getting better after 7 days of treatment.  You are not completely cured after 30 days.  You have any problems caused by your medicines. MAKE SURE YOU:   Understand these instructions.  Will watch your condition.  Will get help right away if you are not doing well or get worse. Document Released: 04/16/2000 Document Revised: 07/12/2011 Document Reviewed: 02/05/2011 [facility] Patient Information 2014 [place].  Plan the prescription antifungal to feet and the areas of skin eruption twice daily for at least a 4-6 week duration. Prescription for Yolanda MangesOxistat is forwarded to pharmacy

## 2013-05-09 NOTE — Progress Notes (Signed)
   Subjective:    Patient ID: [name] [name], female    DOB: 09/05/1970, 43 y.o.   MRN: [id number]  "I'm here to see what they recommend for my fungus toenails.  I have a callus on the bottom of my feet.  I'm breaking out on the sides of my feet. 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 Patient's nails show some slight thickening compared to her fingernails although the nail beds and nail plate and cells are relatively normal trophic no incurvation no subungual debris no lysis from the nailbed. Neurovascular status otherwise intact unremarkable patient mild promontory changes of the foot some diffuse keratoses plantarly secondary to walking activities patient also along the medial and lateral arch areas has a moccasin distribution of an erythematous hyperpigmented rash or plaque consistent with a chronic tinea pedis. This is per neck uncomfortable trigger times patient been applying other creams and lotions with little or no success. Orthopedic biomechanical exam otherwise unremarkable noncontributory     Assessment & Plan:  Assessment this time is chronic tinea pedis bilateral feet and much distribution. The nails show no synovitis, ptosis however may have some thickening and dystrophy do to contusion or shoe irritation. Monitor those free future changes. She has done a topical antifungal for nails however suggested antifungal for skin samples of Oxistat cream are dispensed and prescription of clotrimazole cream is forwarded to her pharmacy. Apply topical antifungal twice daily for a 4-6 week duration until cleared. Reappointed future and as-needed basis for followup and reevaluation  Alvan Dameichard Jawan Chavarria DPM

## 2013-05-10 ENCOUNTER — Ambulatory Visit (INDEPENDENT_AMBULATORY_CARE_PROVIDER_SITE_OTHER): Payer: Self-pay | Admitting: General Surgery

## 2013-05-11 ENCOUNTER — Ambulatory Visit: Payer: Managed Care, Other (non HMO) | Admitting: Internal Medicine

## 2013-05-18 ENCOUNTER — Ambulatory Visit (INDEPENDENT_AMBULATORY_CARE_PROVIDER_SITE_OTHER): Payer: Self-pay | Admitting: General Surgery

## 2013-05-30 ENCOUNTER — Ambulatory Visit (INDEPENDENT_AMBULATORY_CARE_PROVIDER_SITE_OTHER): Payer: Managed Care, Other (non HMO) | Admitting: Internal Medicine

## 2013-05-30 ENCOUNTER — Encounter: Payer: Self-pay | Admitting: Internal Medicine

## 2013-05-30 VITALS — BP 124/78 | HR 90 | Ht 65.0 in | Wt 291.6 lb

## 2013-05-30 DIAGNOSIS — J45909 Unspecified asthma, uncomplicated: Secondary | ICD-10-CM

## 2013-05-30 DIAGNOSIS — G4733 Obstructive sleep apnea (adult) (pediatric): Secondary | ICD-10-CM

## 2013-05-30 NOTE — Assessment & Plan Note (Signed)
She is hopeful she will be accepted for lap-band surgery.

## 2013-05-30 NOTE — Assessment & Plan Note (Signed)
Good compliance and control. Pressure good.  Plan - continue current use.

## 2013-05-30 NOTE — Progress Notes (Signed)
Patient ID: [name] [name], female    DOB: 12/12/1970, 43 y.o.   MRN: [id number]  HPI 11/26/10- 43 yo never smoker followed for OSA, asthma, obesity/hypoventilation (previous bariatric surgery), complicated by relapsing obesity Last here May 07, 2010 Says she has gained weight, but today is "my first day back on track" to lose it again.  CPAP 9,  likes new machine. Able to use it all night every night.  New cough began 2 weeks ago without obvious trigger. She is in draft at work. Denies nose or sinus trouble.  Denies reflux or nocturnal choking.  Notices some dyspnea walking in heat.   05/13/11- 43 yo never smoker followed for OSA, asthma, obesity/hypoventilation (previous bariatric surgery), complicated by relapsing obesity She has put off getting flu vaccine pending physical exam with Dr. Allyne GeeSanders next week. I explained that with the early flu season this year, it is good to get it much earlier in the fall. [facility]ann[facility]. She still uses CPAP at 9 CWP/[facility] all night every night. She has had no asthma exacerbations and no recent respiratory infection or acute concern. She feels well. Uses occasional Claritin but is not needing asthma medications at all.  05/11/12  43 yo never smoker followed for OSA, asthma, obesity/hypoventilation (previous gastric bypass bariatric surgery), complicated by relapsing obesity. FOLLOWS FOR: wearing CPAP 9/ [facility] approx. 4-5 hrs per night, tolerating pressures ok --denies any new concerns at this time. She likes her fullface mask. She has gained 18 pounds over the last year or 2.  05/30/13- 43 yo never smoker followed for OSA, asthma, obesity/hypoventilation (previous gastric bypass bariatric surgery), complicated by relapsing obesity FOLLOWS FOR: Wears CPAP 9/ [facility] every night for about 3-4 hours(thats as long as she sleeps); would like to renew handicap placard as well. Lower  right sided back pain after coughing.   Pt is also going for lap band surgery evaluation. Had gastric bypass 2009. Weight back up. Breathing feels normal. 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 Non-radiating tussive R parasacral pain occ w/ cough.  Review of Systems Constitutional:   No-   weight loss, night sweats, fevers, chills, fatigue, lassitude. HEENT:   No-  headaches, difficulty swallowing, tooth/dental problems, sore throat,       Occasional  sneezing, itching, ear ache, nasal congestion, post nasal drip,  CV:  No-   chest pain, orthopnea, PND, swelling in lower extremities, anasarca, dizziness, palpitations Resp: No- acute  shortness of breath with exertion or at rest.              No-   productive cough,  +non-productive cough,  No- coughing up of blood.              No-   change in color of mucus.  No- wheezing.   Skin: No-   rash or lesions. GI:  No-   heartburn, indigestion, abdominal pain, nausea, vomiting,  GU:  MS:  No-   joint pain or swelling.   Neuro-     nothing unusual Psych:  No- change in mood or affect. No depression or anxiety.  No memory loss.    Objective:   Physical Exam General- Alert, Oriented, Affect-appropriate, Distress- none acute,  Morbidly obese Skin- rash-none, lesions- none, excoriation- none Lymphadenopathy- none Head- atraumatic            Eyes- Gross vision intact, PERRLA, conjunctivae clear secretions  Ears- Hearing, canals normal            Nose- Clear, No- Septal dev, mucus, polyps, erosion, perforation             Throat- Mallampati II-III , mucosa clear , drainage- none, tonsils- atrophic Neck- flexible , trachea midline, no stridor , thyroid nl, carotid no bruit Chest - symmetrical excursion , unlabored           Heart/CV- RRR , no murmur , no gallop  , no rub, nl s1 s2                           - JVD- none , edema- none, stasis changes- none, varices- none           Lung- clear to P&A, wheeze- none,  dullness-none, rub- none                Chest  wall-  Abd- Br/ Gen/ Rectal- Not done, not indicated Extrem- cyanosis- none, clubbing, none, atrophy- none, strength- nl Neuro- grossly intact to observation

## 2013-05-30 NOTE — Patient Instructions (Signed)
We can continue CPAP 9/ [facility]  Please call as needed

## 2013-05-30 NOTE — Assessment & Plan Note (Signed)
Well controlled 

## 2013-06-13 ENCOUNTER — Encounter (INDEPENDENT_AMBULATORY_CARE_PROVIDER_SITE_OTHER): Payer: Self-pay | Admitting: General Surgery

## 2013-06-13 ENCOUNTER — Ambulatory Visit (INDEPENDENT_AMBULATORY_CARE_PROVIDER_SITE_OTHER): Payer: Managed Care, Other (non HMO) | Admitting: General Surgery

## 2013-06-13 DIAGNOSIS — G4733 Obstructive sleep apnea (adult) (pediatric): Secondary | ICD-10-CM

## 2013-06-13 DIAGNOSIS — Z6841 Body Mass Index (BMI) 40.0 and over, adult: Secondary | ICD-10-CM

## 2013-06-13 DIAGNOSIS — Z9884 Bariatric surgery status: Secondary | ICD-10-CM

## 2013-06-13 LAB — COMPREHENSIVE METABOLIC PANEL
ALBUMIN: 4 g/dL (ref 3.5–5.2)
ALK PHOS: 103 U/L (ref 39–117)
ALT: 11 U/L (ref 0–35)
AST: 16 U/L (ref 0–37)
BUN: 10 mg/dL (ref 6–23)
CO2: 30 mEq/L (ref 19–32)
Calcium: 9.2 mg/dL (ref 8.4–10.5)
Chloride: 103 mEq/L (ref 96–112)
Creat: 0.68 mg/dL (ref 0.50–1.10)
GLUCOSE: 74 mg/dL (ref 70–99)
POTASSIUM: 3.9 meq/L (ref 3.5–5.3)
Sodium: 139 mEq/L (ref 135–145)
Total Bilirubin: 0.2 mg/dL (ref 0.2–1.2)
Total Protein: 6.7 g/dL (ref 6.0–8.3)

## 2013-06-13 LAB — LIPID PANEL
CHOLESTEROL: 152 mg/dL (ref 0–200)
HDL: 71 mg/dL (ref >39)
LDL CALC: 72 mg/dL (ref 0–99)
TRIGLYCERIDES: 46 mg/dL (ref <150)
Total CHOL/HDL Ratio: 2.1 Ratio
VLDL: 9 mg/dL (ref 0–40)

## 2013-06-13 LAB — CBC WITH DIFFERENTIAL/PLATELET
BASOS PCT: 0 % (ref 0–1)
Basophils Absolute: 0 10*3/uL (ref 0.0–0.1)
EOS ABS: 0.1 10*3/uL (ref 0.0–0.7)
Eosinophils Relative: 2 % (ref 0–5)
HEMATOCRIT: 36.1 % (ref 36.0–46.0)
HEMOGLOBIN: 11.5 g/dL — AB (ref 12.0–15.0)
Lymphocytes Relative: 37 % (ref 12–46)
Lymphs Abs: 2.5 10*3/uL (ref 0.7–4.0)
MCH: 26.8 pg (ref 26.0–34.0)
MCHC: 31.9 g/dL (ref 30.0–36.0)
MCV: 84.1 fL (ref 78.0–100.0)
MONO ABS: 0.4 10*3/uL (ref 0.1–1.0)
MONOS PCT: 6 % (ref 3–12)
Neutro Abs: 3.7 10*3/uL (ref 1.7–7.7)
Neutrophils Relative %: 55 % (ref 43–77)
Platelets: 414 10*3/uL — ABNORMAL HIGH (ref 150–400)
RBC: 4.29 MIL/uL (ref 3.87–5.11)
RDW: 17 % — ABNORMAL HIGH (ref 11.5–15.5)
WBC: 6.7 10*3/uL (ref 4.0–10.5)

## 2013-06-13 LAB — IRON AND TIBC
%SAT: 7 % — AB (ref 20–55)
Iron: 31 ug/dL — ABNORMAL LOW (ref 42–145)
TIBC: 431 ug/dL (ref 250–470)
UIBC: 400 ug/dL (ref 125–400)

## 2013-06-13 NOTE — Progress Notes (Signed)
Subjective:   obesity, history of gastric bypass  Patient ID: [name] [name], female   DOB: 04/24/1971, 43 y.o.   MRN: [id number]  HPI Patient is a 43 year old female referred by Dr. Allyne GeeSanders for consideration for revisional surgery for morbid obesity with a history of gastric bypass. The patient is status post laparoscopic Roux-en-Y gastric [facility]. At that time she weighed 489 pounds. She did well from her surgery without complication and lost to as low as 267 pounds. In the last few years she has experienced some weight regain to her current weight of 292 pounds. She is for a concerned about the weight gain and she feels that her current weight is significantly affecting her health and activity. She understood feels dramatically better than prior to her bypass when she was oxygen dependent. She currently does have active sleep apnea and feels fatigued for some shortness of breath on exertion and chronic joint pain. She is very concerned about regaining any further weight. She has [facility] had [facility]. She denies any abdominal pain. No vomiting. She is able to eat larger portions that she had previously. She has some dumping sweets but has been eating sweets and bread as well. She does not mind exercising and continues to exercise fairly regularly. She does hold down to jobs.  Past Medical History  Diagnosis Date  . Morbid obesity     bariatric surgery  . Allergic rhinitis   . Iron deficiency anemia     d/t menometrorrhagia  . Acute pulmonary edema 07/01/2003  . Chronic respiratory failure 07/01/2003  . OSA (obstructive sleep apnea) 04/21/2003   Past Surgical History  Procedure Laterality Date  . Uterine fibroid surgery    . Gastric bypass  2009   Current Outpatient Prescriptions  Medication Sig Dispense Refill  . ACZONE 5 % topical gel       . buPROPion (WELLBUTRIN XL) 150 MG 24 hr tablet Take 450 mg by mouth daily.       . clotrimazole  (LOTRIMIN) 1 % cream Apply 1 application topically 2 (two) times daily.  60 g  4  . L-Methylfolate (DEPLIN) 15 MG TABS Take 1 tablet by mouth daily.        . Liraglutide (VICTOZA) 18 MG/3ML SOLN Inject 1.8 mg into the skin daily. for weight loss      . meloxicam (MOBIC) 15 MG tablet Take 15 mg by mouth daily.      Marland Kitchen. triamcinolone cream (KENALOG) 0.1 %        No current facility-administered medications for this visit.   No Known Allergies History  Substance Use Topics  . Smoking status: Never Smoker   . Smokeless tobacco: Never Used  . Alcohol Use: Yes     Comment: occasionally     Review of Systems  Constitutional: Positive for fatigue.  HENT: Negative.   Respiratory: Positive for shortness of breath.        CPAP for sleep apnea  Cardiovascular: Negative.   Gastrointestinal: Negative.   Genitourinary: Negative.   Musculoskeletal: Positive for arthralgias.  Neurological: Negative.        Objective:   Physical Exam BP 122/90  Pulse 84  Temp(Src) 97.1 F (36.2 C) (Oral)  Resp 14  Ht 5\' 5"  (1.651 m)  Wt 292 lb (132.45 kg)  BMI 48.59 kg/m2 General: Alert, morbidly obese African American female, in no distress Skin: Warm and dry without rash or infection. HEENT: No  palpable masses or thyromegaly. Sclera nonicteric. Pupils equal round and reactive. Oropharynx clear. Lymph nodes: No cervical, supraclavicular, or inguinal nodes palpable. Lungs: Breath sounds clear and equal without increased work of breathing Cardiovascular: Regular rate and rhythm without murmur. No JVD or edema. Peripheral pulses intact. Abdomen: Nondistended. Soft and nontender. Healed panniculectomy incision.  lkNo masses palpable. No organomegaly. No palpable hernias. Extremities: No edema or joint swelling or deformity. No chronic venous stasis changes. Neurologic: Alert and fully oriented. Gait normal.    Assessment:     43 year old female with history of arthroscopic Roux-en-Y gastric bypass in  2009 4 extreme morbid obesity. She is actually had a fairly good result with almost 200 pound weight loss but due to her markedly high starting PBI she still remains morbidly obese with difficulty with daily activities, chronic joint pain, sleep apnea and some shortness of breath on exertion. I recommended to her that we began with initial evaluation including upper GI series to assess her pouch size to look for any evidence of gastric gastric fistula. I would like her also to see our psychologist and nutritionist in regards to nonoperative strategies to improve her weight and evaluation for possible revisional surgery. We discussed revisional procedures that are available including distalization, pouch resizing and band of her bypass. We discussed that the results of revisional surgery are less predictable and not as well studied. She is very interested in band over bypass. We discussed this procedure including its nature and risks of anesthetic complications, intestinal injury, erosion, slippage, mechanical failure and failure to lose weight. At this point we will proceed with evaluation with GI series and lab work and second attrition evaluations as above. I will call her with the results and plan to follow in 3 months.    Plan:     As above

## 2013-06-14 LAB — VITAMIN B12: VITAMIN B 12: 896 pg/mL (ref 211–911)

## 2013-06-14 LAB — T4: T4, Total: 10.8 ug/dL (ref 5.0–12.5)

## 2013-06-14 LAB — TSH: TSH: 2.991 u[IU]/mL (ref 0.350–4.500)

## 2013-06-14 LAB — FOLATE

## 2013-06-16 LAB — VITAMIN B1: VITAMIN B1 (THIAMINE): 11 nmol/L (ref 8–30)

## 2013-07-04 ENCOUNTER — Ambulatory Visit (HOSPITAL_COMMUNITY)
Admission: RE | Admit: 2013-07-04 | Discharge: 2013-07-04 | Disposition: A | Discharge status: Home / Self Care | Payer: Managed Care, Other (non HMO) | Source: Ambulatory Visit | Attending: General Surgery | Admitting: General Surgery

## 2013-07-04 DIAGNOSIS — G4733 Obstructive sleep apnea (adult) (pediatric): Secondary | ICD-10-CM | POA: Insufficient documentation

## 2013-07-04 DIAGNOSIS — Z6841 Body Mass Index (BMI) 40.0 and over, adult: Secondary | ICD-10-CM | POA: Insufficient documentation

## 2013-07-04 DIAGNOSIS — D509 Iron deficiency anemia, unspecified: Secondary | ICD-10-CM | POA: Insufficient documentation

## 2013-07-04 DIAGNOSIS — K224 Dyskinesia of esophagus: Secondary | ICD-10-CM | POA: Insufficient documentation

## 2013-07-04 DIAGNOSIS — Z9884 Bariatric surgery status: Secondary | ICD-10-CM | POA: Insufficient documentation

## 2013-07-24 ENCOUNTER — Ambulatory Visit: Payer: Managed Care, Other (non HMO) | Admitting: Dietician

## 2013-08-08 ENCOUNTER — Telehealth: Payer: Self-pay | Admitting: Internal Medicine

## 2013-08-08 NOTE — Telephone Encounter (Signed)
Pt returned call.  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  When calling back, would like recs for any OTC meds to be left on her VM if she doesn't answer.  Thanks.  [name] FairyHolly D Pryor

## 2013-08-08 NOTE — Telephone Encounter (Signed)
Per CY-have patient take Allegra 180 once daily and use [place]  allergy eyes drops as directed on package. I have left detailed message as requested on patients number provided. Pt has been instructed to call our office with any questions or concerns she may have.

## 2013-08-08 NOTE — Telephone Encounter (Signed)
[email or web address].[name] [name], CMA  

## 2013-08-11 ENCOUNTER — Encounter: Payer: Managed Care, Other (non HMO) | Attending: General Surgery | Admitting: Dietician

## 2013-08-11 ENCOUNTER — Encounter: Payer: Self-pay | Admitting: Dietician

## 2013-08-11 DIAGNOSIS — Z713 Dietary counseling and surveillance: Secondary | ICD-10-CM | POA: Insufficient documentation

## 2013-08-11 DIAGNOSIS — Z6841 Body Mass Index (BMI) 40.0 and over, adult: Secondary | ICD-10-CM | POA: Insufficient documentation

## 2013-08-11 DIAGNOSIS — Z01818 Encounter for other preprocedural examination: Secondary | ICD-10-CM | POA: Insufficient documentation

## 2013-08-11 NOTE — Patient Instructions (Signed)
Patient to call the [facility] to enroll in Pre-Op and Post-Op Nutrition Education when surgery date is scheduled. 

## 2013-08-11 NOTE — Progress Notes (Signed)
  Pre-Op Assessment Visit:  Pre-Operative LAGB Surgery  Medical Nutrition Therapy:  Appt start time: 1400   End time:  1430.  Patient was seen on 08/11/2013 for Pre-Operative LAGB Nutrition Assessment. Assessment and [facility] on 08/11/2013.   Preferred Learning Style:  No preference indicated   Learning Readiness:  Ready   Handouts given during visit include:  Pre-Op Goals Bariatric Surgery Protein Shakes  Teaching Method Utilized: Visual Auditory  Barriers to learning/adherence to lifestyle change: none  Demonstrated degree of understanding via:  Teach Back   Patient to call the [facility] to enroll in Pre-Op and Post-Op Nutrition Education when surgery date is scheduled.

## 2013-10-05 ENCOUNTER — Ambulatory Visit (INDEPENDENT_AMBULATORY_CARE_PROVIDER_SITE_OTHER): Payer: Managed Care, Other (non HMO) | Admitting: General Surgery

## 2013-10-10 ENCOUNTER — Ambulatory Visit (INDEPENDENT_AMBULATORY_CARE_PROVIDER_SITE_OTHER): Payer: Managed Care, Other (non HMO) | Admitting: General Surgery

## 2013-10-10 ENCOUNTER — Encounter (INDEPENDENT_AMBULATORY_CARE_PROVIDER_SITE_OTHER): Payer: Self-pay | Admitting: General Surgery

## 2013-10-10 DIAGNOSIS — Z9884 Bariatric surgery status: Secondary | ICD-10-CM

## 2013-10-10 NOTE — Progress Notes (Signed)
Subjective:   obesity, history of gastric bypass  Patient ID: [name] [name], female   DOB: 08/10/1970, 43 y.o.   MRN: [id number]  HPI Patient is a 43 year old female referred by Dr. Allyne GeeSanders for consideration for revisional surgery for morbid obesity with a history of gastric bypass. I initially saw her in February of this year.  The patient is status post laparoscopic Roux-en-Y gastric [facility]. At that time she weighed 489 pounds. She did well from her surgery without complication and lost to as low as 267 pounds. In the last few years she has experienced some weight regain to her current weight of 292 pounds. She is for a concerned about the weight gain and she feels that her current weight is significantly affecting her health and activity. She understood feels dramatically better than prior to her bypass when she was oxygen dependent. She currently does have active sleep apnea and feels fatigued for some shortness of breath on exertion and chronic joint pain. She is very concerned about regaining any further weight. She has [facility]. She denies any abdominal pain. No vomiting.since her initial visit she has gone back to her psychologist for further evaluation. No change in medications. She is seen our dietitian and has made some changes in her diet coming-out bread and some sugar that she was eating. She has increased her exercise. Her weight is down 10 pounds from when I saw her in February.  GI series was obtained which shows no specific complicating factors with a small gastric pouch, patent anastomosis and no hiatal hernia. No evidence of fistula.  Past Medical History  Diagnosis Date  . Morbid obesity     bariatric surgery  . Allergic rhinitis   . Iron deficiency anemia     d/t menometrorrhagia  . Acute pulmonary edema 07/01/2003  . Chronic respiratory failure 07/01/2003  . OSA (obstructive sleep apnea) 04/21/2003   Past  Surgical History  Procedure Laterality Date  . Uterine fibroid surgery    . Gastric bypass  2009   Current Outpatient Prescriptions  Medication Sig Dispense Refill  . ACZONE 5 % topical gel       . buPROPion (WELLBUTRIN XL) 150 MG 24 hr tablet Take 450 mg by mouth daily.       . clotrimazole (LOTRIMIN) 1 % cream Apply 1 application topically 2 (two) times daily.  60 g  4  . L-Methylfolate (DEPLIN) 15 MG TABS Take 1 tablet by mouth daily.        . Liraglutide (VICTOZA) 18 MG/3ML SOLN Inject 1.8 mg into the skin daily. for weight loss      . meloxicam (MOBIC) 15 MG tablet Take 15 mg by mouth daily.      Marland Kitchen. triamcinolone cream (KENALOG) 0.1 %       . ZIANA gel daily.       No current facility-administered medications for this visit.   No Known Allergies History  Substance Use Topics  . Smoking status: Never Smoker   . Smokeless tobacco: Never Used  . Alcohol Use: Yes     Comment: occasionally     Review of Systems  Constitutional: Positive for fatigue.  HENT: Negative.   Respiratory: Positive for shortness of breath.        CPAP for sleep apnea  Cardiovascular: Negative.   Gastrointestinal: Negative.   Genitourinary: Negative.   Musculoskeletal: Positive for arthralgias.  Neurological: Negative.  Objective:   Physical Exam BP 124/88  Pulse 80  Temp(Src) 98.7 F (37.1 C) (Oral)  Resp 14  Ht 5\' 5"  (1.651 m)  Wt 282 lb 6.4 oz (128.096 kg)  BMI 46.99 kg/m2 General: Alert, morbidly obese African American female, in no distress Exam not repeated today      Assessment:     43 year old female with history of arthroscopic Roux-en-Y gastric bypass in 2009 4 extreme morbid obesity. She is actually had a fairly good result with almost 200 pound weight loss but due to her markedly high starting PBI she still remains morbidly obese with difficulty with daily activities, chronic joint pain, sleep apnea and some shortness of breath on exertion. There are no specific  complicating factors such as fistula or significant pouch enlargement on her upper GI series. She has made some progress and guided behavioral modifications with 10 pound weight loss. It is questionable whether she will be able to lose enough weight to significantly affect her comorbidities. Currently her information is that revisional surgery would not be covered by her insurance I discussed the will double check on this for her. I encouraged her that she has made some progress with behavioral modifications and her 10 pound weight loss is significant. Continue current strategies I will see her back in 3 months.     Plan:     As above

## 2013-10-10 NOTE — Patient Instructions (Signed)
Continue with your positive changes and exercise and your diet modification. We will clarify your insurance coverage and see back in 3 months.

## 2014-06-05 ENCOUNTER — Ambulatory Visit: Payer: Managed Care, Other (non HMO) | Admitting: Internal Medicine

## 2014-06-17 ENCOUNTER — Ambulatory Visit (INDEPENDENT_AMBULATORY_CARE_PROVIDER_SITE_OTHER): Payer: BLUE CROSS/BLUE SHIELD | Admitting: Internal Medicine

## 2014-06-17 ENCOUNTER — Encounter: Payer: Self-pay | Admitting: Internal Medicine

## 2014-06-17 ENCOUNTER — Ambulatory Visit: Payer: Managed Care, Other (non HMO) | Admitting: Internal Medicine

## 2014-06-17 VITALS — BP 126/94 | HR 85 | Ht 65.0 in | Wt 269.6 lb

## 2014-06-17 DIAGNOSIS — G4733 Obstructive sleep apnea (adult) (pediatric): Secondary | ICD-10-CM

## 2014-06-17 NOTE — Patient Instructions (Signed)
Order- DME [facility]  Replacement for old CPAP machine, getting worn- 9 cwp, mask of choice, humidifier, supplies       Dx OSA  You can let [facility] know about your insurance change. If there is an issue please let us know.

## 2014-06-17 NOTE — Progress Notes (Signed)
Patient ID: [name] [name], female    DOB: [birth year], 44 y.o.   MRN: [id number]  HPI 11/26/10- 77 yo never smoker followed for OSA, asthma, obesity/hypoventilation (previous bariatric surgery), complicated by relapsing obesity Last here May 07, 2010 Says she has gained weight, but today is "my first day back on track" to lose it again.  CPAP 9,  likes new machine. Able to use it all night every night.  New cough began 2 weeks ago without obvious trigger. She is in draft at work. Denies nose or sinus trouble.  Denies reflux or nocturnal choking.  Notices some dyspnea walking in heat.   05/13/11- 42 yo never smoker followed for OSA, asthma, obesity/hypoventilation (previous bariatric surgery), complicated by relapsing obesity She has put off getting flu vaccine pending physical exam with Dr. Allyne Gee next week. I explained that with the early flu season this year, it is good to get it much earlier in the fall. She is pending surgery at [facility] to reduce her pannus. She still uses CPAP at 9 CWP/[facility] all night every night. She has had no asthma exacerbations and no recent respiratory infection or acute concern. She feels well. Uses occasional Claritin but is not needing asthma medications at all.  05/11/12  44 yo never smoker followed for OSA, asthma, obesity/hypoventilation (previous gastric bypass bariatric surgery), complicated by relapsing obesity. FOLLOWS FOR: wearing CPAP 9/ [facility] approx. 4-5 hrs per night, tolerating pressures ok --denies any new concerns at this time. She likes her fullface mask. She has gained 18 pounds over the last year or 2.  05/30/13- 55 yo never smoker followed for OSA, asthma, obesity/hypoventilation (previous gastric bypass bariatric surgery), complicated by relapsing obesity FOLLOWS FOR: Wears CPAP 9/ [facility] every night for about 3-4 hours(thats as long as she sleeps); would like to renew handicap placard as well. Lower  right sided back pain after coughing.   Pt is also going for lap band surgery evaluation. Had gastric bypass 2009. Weight back up. Breathing feels normal. 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 Non-radiating tussive R parasacral pain occ w/ cough.  06/17/14-43 yo never smoker followed for OSA, asthma, obesity/hypoventilation (previous gastric bypass bariatric surgery), complicated by relapsing obesity FOLLOW FOR:  Wears CPAP 9/ [facility] every night, approx 3-4 hours nightly.  insurance has changed, does she need to change DME? NPSG 04/21/03  AHI 101/ hr, CPAP to 18, weight was 400 lbs  Review of Systems Constitutional:   No-   weight loss, night sweats, fevers, chills, fatigue, lassitude. HEENT:   No-  headaches, difficulty swallowing, tooth/dental problems, sore throat,       Occasional  sneezing, itching, ear ache, nasal congestion, post nasal drip,  CV:  No-   chest pain, orthopnea, PND, swelling in lower extremities, anasarca, dizziness, palpitations Resp: No- acute  shortness of breath with exertion or at rest.              No-   productive cough,  +non-productive cough,  No- coughing up of blood.              No-   change in color of mucus.  No- wheezing.   Skin: No-   rash or lesions. GI:  No-   heartburn, indigestion, abdominal pain, nausea, vomiting,  GU:  MS:  No-   joint pain or swelling.   Neuro-     nothing unusual Psych:  No- change in mood or affect. No depression or anxiety.  No memory loss.    Objective:   Physical Exam General- Alert, Oriented, Affect-appropriate, Distress- none acute,  Morbidly obese Skin- rash-none, lesions- none, excoriation- none Lymphadenopathy- none Head- atraumatic            Eyes- Gross vision intact, PERRLA, conjunctivae clear secretions            Ears- Hearing, canals normal            Nose- Clear, No- Septal dev, mucus, polyps, erosion, perforation             Throat- Mallampati II-III , mucosa clear , drainage- none, tonsils- atrophic Neck- flexible , trachea  midline, no stridor , thyroid nl, carotid no bruit Chest - symmetrical excursion , unlabored           Heart/CV- RRR , no murmur , no gallop  , no rub, nl s1 s2                           - JVD- none , edema- none, stasis changes- none, varices- none           Lung- clear to P&A, wheeze- none,  dullness-none, rub- none                Chest wall-  Abd- Br/ Gen/ Rectal- Not done, not indicated Extrem- cyanosis- none, clubbing, none, atrophy- none, strength- nl Neuro- grossly intact to observation

## 2014-06-17 NOTE — Assessment & Plan Note (Signed)
CPAP compliance has been good control seems to be sufficient and CPAP remains medically necessary. Weight loss has helped some. Her machine is now older and wearing out. We discussed criteria for replacement and she will review this with her DME company. Plan-replacement for CPAP

## 2014-06-17 NOTE — Assessment & Plan Note (Signed)
In 2004 she weighed 400 pounds. Weight now is down around 282, still too much for her but much better

## 2014-07-31 ENCOUNTER — Telehealth: Payer: Self-pay | Admitting: Internal Medicine

## 2014-07-31 NOTE — Telephone Encounter (Signed)
Pt is aware of CY's recommendations. Nothing further was needed. 

## 2014-07-31 NOTE — Telephone Encounter (Signed)
Try otc allergy eye drops Naphcon or Visine AC

## 2014-07-31 NOTE — Telephone Encounter (Signed)
Spoke with pt. States that she is having issues with her allergies. Her eyes are being affected. Reports redness and itching. Would like CY's recommendations.  CY - please advise. Thanks.

## 2014-08-16 ENCOUNTER — Other Ambulatory Visit: Payer: Self-pay | Admitting: Dermatology

## 2014-10-22 ENCOUNTER — Ambulatory Visit (INDEPENDENT_AMBULATORY_CARE_PROVIDER_SITE_OTHER): Payer: BLUE CROSS/BLUE SHIELD | Admitting: Podiatry

## 2014-10-22 VITALS — BP 105/69 | HR 91 | Resp 12

## 2014-10-22 DIAGNOSIS — M779 Enthesopathy, unspecified: Secondary | ICD-10-CM | POA: Diagnosis not present

## 2014-10-22 NOTE — Progress Notes (Signed)
   Subjective:    Patient ID: [name] [name], female    DOB: 08/25/1970, 44 y.o.   MRN: [id number]  HPI Patient is requesting new orthotics which have helped her in the past.   Review of Systems     Objective:   Physical Exam        Assessment & Plan:

## 2014-10-23 NOTE — Progress Notes (Signed)
Subjective:     Patient ID: [name] [name], female   DOB: 04-15-1971, 44 y.o.   MRN: [id number]  HPI patient states that her orthotics have completely run down and that she needs new orthotics at this time   Review of Systems     Objective:   Physical Exam Neurovascular status intact muscle strength adequate with range of motion within normal limits. Patient's found have severe flatfoot deformity with inflammation around the medial arch and at the insertion of posterior tibial tendon bilateral    Assessment:     Tendinitis secondary to severe foot structural issues    Plan:     Patient is scanned for customized Berkley type orthotic devices and is given instructions for supportive shoe gear and will be seen back when orthotics are returned

## 2014-11-15 ENCOUNTER — Ambulatory Visit: Payer: BLUE CROSS/BLUE SHIELD | Admitting: *Deleted

## 2014-11-15 DIAGNOSIS — M779 Enthesopathy, unspecified: Secondary | ICD-10-CM

## 2014-11-15 NOTE — Progress Notes (Signed)
Patient ID: [name] [name], female   DOB: 09/08/1970, 44 y.o.   MRN: [id number] Patient presents for orthotic pick up.  Verbal and written break in and wear instructions given.  Patient will follow up in 4 weeks if symptoms worsen or fail to improve.

## 2014-11-15 NOTE — Patient Instructions (Signed)

## 2015-02-24 ENCOUNTER — Telehealth: Payer: Self-pay | Admitting: *Deleted

## 2015-02-24 NOTE — Telephone Encounter (Signed)
Left a 3rd voicemail for patient.  We do not normally order a single orthotic they are made in pairs.  After speaking to my supervisor and the lab we will make the left only orthotic for a charge of $99 this is not covered by her insurance and must be paid in advance of placing the order.  Left my name number and extension again.

## 2015-02-25 DIAGNOSIS — M722 Plantar fascial fibromatosis: Secondary | ICD-10-CM

## 2015-05-09 ENCOUNTER — Other Ambulatory Visit: Payer: Self-pay | Admitting: Surgical Oncology

## 2015-05-19 ENCOUNTER — Ambulatory Visit
Admission: RE | Admit: 2015-05-19 | Discharge: 2015-05-19 | Disposition: A | Discharge status: Home / Self Care | Payer: BLUE CROSS/BLUE SHIELD | Source: Ambulatory Visit | Attending: Surgical Oncology | Admitting: Surgical Oncology

## 2015-06-18 ENCOUNTER — Ambulatory Visit: Payer: BLUE CROSS/BLUE SHIELD | Admitting: Internal Medicine

## 2015-07-31 DIAGNOSIS — R632 Polyphagia: Secondary | ICD-10-CM | POA: Insufficient documentation

## 2015-09-18 ENCOUNTER — Ambulatory Visit (INDEPENDENT_AMBULATORY_CARE_PROVIDER_SITE_OTHER): Payer: BLUE CROSS/BLUE SHIELD | Admitting: Internal Medicine

## 2015-09-18 ENCOUNTER — Encounter: Payer: Self-pay | Admitting: Internal Medicine

## 2015-09-18 VITALS — BP 124/68 | HR 85 | Ht 65.0 in | Wt 287.0 lb

## 2015-09-18 DIAGNOSIS — G4733 Obstructive sleep apnea (adult) (pediatric): Secondary | ICD-10-CM

## 2015-09-18 NOTE — Patient Instructions (Signed)
Order- DME [facility]- continue CPAP 9, mask of choice, humidifier, supplies, AirView, dx OSA                 Needs supplies now. Please give any advice on travelling with CPAP  Suggest otc nasal spray like Flonase/ fluticasone, 1-2 puffs each nostril once daily at bedtime                     Antihistamine like claritin/ loratadine,   1 daily   As needed while allergy season lasts  Have a wonderful time on your trip

## 2015-09-18 NOTE — Progress Notes (Signed)
Patient ID: [name] [name], female    DOB: 12/30/1970, 45 y.o.   MRN: [id number]  HPI 11/26/10- 65 yo never smoker followed for OSA, asthma, obesity/hypoventilation (previous bariatric surgery), complicated by relapsing obesity Last here May 07, 2010 Says she has gained weight, but today is "my first day back on track" to lose it again.  CPAP 9,  likes new machine. Able to use it all night every night.  New cough began 2 weeks ago without obvious trigger. She is in draft at work. Denies nose or sinus trouble.  Denies reflux or nocturnal choking.  Notices some dyspnea walking in heat.   05/13/11- 89 yo never smoker followed for OSA, asthma, obesity/hypoventilation (previous bariatric surgery), complicated by relapsing obesity She has put off getting flu vaccine pending physical exam with Dr. Allyne Gee next week. I explained that with the early flu season this year, it is good to get it much earlier in the fall. She is pending surgery at [facility] to reduce her pannus. She still uses CPAP at 9 CWP/[facility] all night every night. She has had no asthma exacerbations and no recent respiratory infection or acute concern. She feels well. Uses occasional Claritin but is not needing asthma medications at all.  05/11/12  45 yo never smoker followed for OSA, asthma, obesity/hypoventilation (previous gastric bypass bariatric surgery), complicated by relapsing obesity. FOLLOWS FOR: wearing CPAP 9/ [facility] approx. 4-5 hrs per night, tolerating pressures ok --denies any new concerns at this time. She likes her fullface mask. She has gained 18 pounds over the last year or 2.  05/30/13- 64 yo never smoker followed for OSA, asthma, obesity/hypoventilation (previous gastric bypass bariatric surgery), complicated by relapsing obesity FOLLOWS FOR: Wears CPAP 9/ [facility] every night for about 3-4 hours(thats as long as she sleeps); would like to renew handicap placard as well. Lower  right sided back pain after coughing.   Pt is also going for lap band surgery evaluation. Had gastric bypass 2009. Weight back up. Breathing feels normal. 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 Non-radiating tussive R parasacral pain occ w/ cough.  06/17/14-43 yo never smoker followed for OSA, asthma, obesity/hypoventilation (previous gastric bypass bariatric surgery), complicated by relapsing obesity FOLLOW FOR:  Wears CPAP 9/ [facility] every night, approx 3-4 hours nightly.  insurance has changed, does she need to change DME? NPSG 04/21/03  AHI 101/ hr, CPAP to 18, weight was 400 lbs  09/18/2015-45 year old female never smoker followed for OSA, asthma, obesity/hypoventilation (previous gastric bypass bariatric surgery), complicated by morbid obesity CPAP 9/ [facility] FOLLOWS FOR: WUJ:[id number] Pt wears CPAP every night for about 2-3 hours as she works 2 jobs. Will need new supplies and DL as patient forgot her card.  Weight today 287 pounds, O2 saturation 99%    Review of Systems Constitutional:   No-   weight loss, night sweats, fevers, chills, fatigue, lassitude. HEENT:   No-  headaches, difficulty swallowing, tooth/dental problems, sore throat,       Occasional  sneezing, itching, ear ache, nasal congestion, post nasal drip,  CV:  No-   chest pain, orthopnea, PND, swelling in lower extremities, anasarca, dizziness, palpitations Resp: No- acute  shortness of breath with exertion or at rest.              No-   productive cough,  +non-productive cough,  No- coughing up of blood.              No-   change in  color of mucus.  No- wheezing.   Skin: No-   rash or lesions. GI:  No-   heartburn, indigestion, abdominal pain, nausea, vomiting,  GU:  MS:  No-   joint pain or swelling.   Neuro-     nothing unusual Psych:  No- change in mood or affect. No depression or anxiety.  No memory loss.    Objective:   Physical Exam General- Alert, Oriented, Affect-appropriate, Distress- none acute,  Morbidly obese Skin- rash-none,  lesions- none, excoriation- none Lymphadenopathy- none Head- atraumatic            Eyes- Gross vision intact, PERRLA, conjunctivae clear secretions            Ears- Hearing, canals normal            Nose- Clear, No- Septal dev, mucus, polyps, erosion, perforation             Throat- Mallampati II-III , mucosa clear , drainage- none, tonsils- atrophic Neck- flexible , trachea midline, no stridor , thyroid nl, carotid no bruit Chest - symmetrical excursion , unlabored           Heart/CV- RRR , no murmur , no gallop  , no rub, nl s1 s2                           - JVD- none , edema- none, stasis changes- none, varices- none           Lung- clear to P&A, wheeze- none,  dullness-none, rub- none                Chest wall-  Abd- Br/ Gen/ Rectal- Not done, not indicated Extrem- cyanosis- none, clubbing, none, atrophy- none, strength- nl Neuro- grossly intact to observation

## 2016-07-28 ENCOUNTER — Other Ambulatory Visit: Payer: Self-pay | Admitting: Internal Medicine

## 2016-07-28 DIAGNOSIS — Z1231 Encounter for screening mammogram for malignant neoplasm of breast: Secondary | ICD-10-CM

## 2016-08-16 ENCOUNTER — Ambulatory Visit: Payer: BLUE CROSS/BLUE SHIELD

## 2016-12-31 ENCOUNTER — Ambulatory Visit
Admission: RE | Admit: 2016-12-31 | Discharge: 2016-12-31 | Disposition: A | Discharge status: Home / Self Care | Payer: BLUE CROSS/BLUE SHIELD | Source: Ambulatory Visit | Attending: Internal Medicine | Admitting: Internal Medicine

## 2016-12-31 DIAGNOSIS — Z1231 Encounter for screening mammogram for malignant neoplasm of breast: Secondary | ICD-10-CM

## 2017-01-20 DIAGNOSIS — Z9884 Bariatric surgery status: Secondary | ICD-10-CM | POA: Insufficient documentation

## 2017-01-20 DIAGNOSIS — Z6841 Body Mass Index (BMI) 40.0 and over, adult: Secondary | ICD-10-CM | POA: Insufficient documentation

## 2017-03-04 ENCOUNTER — Telehealth: Payer: Self-pay | Admitting: Internal Medicine

## 2017-03-04 DIAGNOSIS — G4733 Obstructive sleep apnea (adult) (pediatric): Secondary | ICD-10-CM

## 2017-03-04 NOTE — Telephone Encounter (Signed)
Called and lmomtcb x 1   

## 2017-03-04 NOTE — Telephone Encounter (Signed)
No break in therapy- is she ok with using [facility] again?   Order continue CPAP. Patient needs replacement mask, supplies   Dx OSA

## 2017-03-04 NOTE — Telephone Encounter (Signed)
Pt is calling for cpap supplies and stated that she does not have a current DME company---pt was last seen by CY on 09/18/15 and at that time she was using [facility]---pt is needing cpap supplies and would like to have this order sent in . Pt has a pending appt with CY on 05/12/17.  CY please advise. Thanks   No Known Allergies

## 2017-03-07 NOTE — Telephone Encounter (Signed)
LMTCB

## 2017-03-07 NOTE — Telephone Encounter (Signed)
lmomtcb x 3 for the pt.   

## 2017-03-07 NOTE — Telephone Encounter (Signed)
Pt returned phone call.  

## 2017-03-08 NOTE — Telephone Encounter (Signed)
Patient had another appointment - pt states she needs cpap supplies sent into another company other than [facility] - she would like notes placed in the chart so that when she calls back someone will be able to tell her what she should do. - She can be reached at [phone] -pr

## 2017-03-08 NOTE — Telephone Encounter (Signed)
lmtcb x4 for pt.  

## 2017-03-08 NOTE — Telephone Encounter (Signed)
Patient came to the office - states she can't get the triage nurse - she is waiting in the lobby -pr

## 2017-03-08 NOTE — Telephone Encounter (Signed)
Pt is aware of CY's below message and voiced her understanding.  Order has been placed to [facility].  Nothing further needed.

## 2017-03-31 ENCOUNTER — Telehealth: Payer: Self-pay | Admitting: Internal Medicine

## 2017-03-31 DIAGNOSIS — G4733 Obstructive sleep apnea (adult) (pediatric): Secondary | ICD-10-CM

## 2017-03-31 NOTE — Telephone Encounter (Signed)
Triage-please see phone note from 11/2.  Apparently she has not changed DME companies. Plan-okay to order DME [facility] replacement CPAP mask of choice and supplies  Dx OSA Please make her a return OV as available with me or NP in next 4-6 weeks.

## 2017-03-31 NOTE — Telephone Encounter (Signed)
ATC pt, no answer. Left message for pt to call back.  CY ok to order CPAP supplies? Last OV on 09/18/2015.

## 2017-03-31 NOTE — Telephone Encounter (Signed)
Placed order for CPAP supplies. We have to advise pt to RTC to see CY and make appt when she calls back.

## 2017-04-01 NOTE — Telephone Encounter (Signed)
ATC pt, no answer. Left message for pt to call back.  

## 2017-04-05 NOTE — Telephone Encounter (Signed)
Patient is already scheduled to see CY in 5 weeks from now on 1.10.19 - this appt was scheduled in Sept 2018 ( this is also documented in the [id number] phone note)   Since the order has already been placed twice, with the most recent being done nearly 1 week ago and pt is already scheduled to see the provider, will sign off on phone note

## 2017-05-12 ENCOUNTER — Ambulatory Visit: Payer: BLUE CROSS/BLUE SHIELD | Admitting: Internal Medicine

## 2017-07-28 ENCOUNTER — Ambulatory Visit: Payer: BLUE CROSS/BLUE SHIELD | Admitting: Internal Medicine

## 2017-07-28 ENCOUNTER — Encounter: Payer: Self-pay | Admitting: Internal Medicine

## 2017-07-28 VITALS — BP 122/70 | HR 102 | Ht 65.0 in | Wt 299.0 lb

## 2017-07-28 DIAGNOSIS — J3089 Other allergic rhinitis: Secondary | ICD-10-CM

## 2017-07-28 DIAGNOSIS — J302 Other seasonal allergic rhinitis: Secondary | ICD-10-CM

## 2017-07-28 DIAGNOSIS — G4733 Obstructive sleep apnea (adult) (pediatric): Secondary | ICD-10-CM | POA: Diagnosis not present

## 2017-07-28 NOTE — Progress Notes (Signed)
Patient ID: [name] [name], female    DOB: 03/14/1971, 47 y.o.   MRN: [id number]  HPI female never smoker followed for OSA, asthma, obesity/hypoventilation (previous gastric bypass bariatric surgery), complicated by morbid obesity NPSG 04/21/03  AHI 101/ hr, CPAP to 18, weight was 400 lbs ------------------------------------------------------------------------------------------------------------------------  09/18/2015-47 year old female never smoker followed for OSA, asthma, obesity/hypoventilation (previous gastric bypass bariatric surgery), complicated by morbid obesity CPAP 9/ [facility] FOLLOWS FOR: WUJ:[id number]:[facility] Pt wears CPAP every night for about 2-3 hours as she works 2 jobs. Will need new supplies and DL as patient forgot her card.  Weight today 287 pounds, O2 saturation 99%  07/28/17- 47 year old female never smoker followed for OSA, asthma, obesity/hypoventilation (previous gastric bypass bariatric surgery), complicated by morbid obesity CPAP 9/[facility]>> new DME, replace machine, auto 5-12 ----OSA: DME Pt states she is not with [facility] and no one has taken her on as a patient for supplies. Will need to order new DME today Pt wears CPAP nightly DL attached.  Compliance has been good and she feels much better sleeping with CPAP.  Machine is at least 49 years old. Began sneezing with lawnmowing this week-spring pollen allergy.  Review of Systems sign = positive Constitutional:   No-   weight loss, night sweats, fevers, chills, fatigue, lassitude. HEENT:   No-  headaches, difficulty swallowing, tooth/dental problems, sore throat,       Occasional  sneezing, itching, ear ache, nasal congestion, post nasal drip,  CV:  No-   chest pain, orthopnea, PND, swelling in lower extremities, anasarca, dizziness, palpitations Resp: No- acute  shortness of breath with exertion or at rest.              No-   productive cough,  +non-productive cough,  No- coughing up of blood.              No-   change  in color of mucus.  No- wheezing.   Skin: No-   rash or lesions. GI:  No-   heartburn, indigestion, abdominal pain, nausea, vomiting,  GU:  MS:  No-   joint pain or swelling.   Neuro-     nothing unusual Psych:  No- change in mood or affect. No depression or anxiety.  No memory loss.    Objective:   Physical Exam General- Alert, Oriented, Affect-appropriate, Distress- none acute, + Morbidly obese Skin- rash-none, lesions- none, excoriation- none Lymphadenopathy- none Head- atraumatic            Eyes- Gross vision intact, PERRLA, conjunctivae clear secretions            Ears- Hearing, canals normal            Nose- Clear, No- Septal dev, mucus, polyps, erosion, perforation             Throat- Mallampati II-III , mucosa clear , drainage- none, tonsils- atrophic Neck- flexible , trachea midline, no stridor , thyroid nl, carotid no bruit Chest - symmetrical excursion , unlabored           Heart/CV- RRR , no murmur , no gallop  , no rub, nl s1 s2                           - JVD- none , edema- none, stasis changes- none, varices- none           Lung- clear to P&A, wheeze- none,  dullness-none, rub- none  Chest wall-  Abd- Br/ Gen/ Rectal- Not done, not indicated Extrem- cyanosis- none, clubbing, none, atrophy- none, strength- nl Neuro- grossly intact to observation

## 2017-07-28 NOTE — Patient Instructions (Signed)
Order- need to establish with DME for continuity- replace old CPAP machine, auto 5-12, mask of choice, humidifier, supplies, AirView  For the allergies- suggest you use Flonase nasal spray and Claritin antihistamine, about 30 minutes before yard work. You can also get dust and pollen masks at the drug store.

## 2017-07-31 NOTE — Assessment & Plan Note (Signed)
We discussed use of mask, Flonase, pretreatment with antihistamine before yard work.

## 2017-07-31 NOTE — Assessment & Plan Note (Signed)
Appropriate to reestablish with DME to continue CPAP, replacing old machine and changing to auto 5-12

## 2017-07-31 NOTE — Assessment & Plan Note (Signed)
Baseline weight was 400 pounds before bariatric surgery, now 299 pounds is still too much.

## 2018-01-11 DIAGNOSIS — R030 Elevated blood-pressure reading, without diagnosis of hypertension: Secondary | ICD-10-CM | POA: Diagnosis not present

## 2018-01-11 DIAGNOSIS — R55 Syncope and collapse: Secondary | ICD-10-CM | POA: Diagnosis not present

## 2018-01-30 ENCOUNTER — Ambulatory Visit: Payer: BLUE CROSS/BLUE SHIELD | Admitting: Internal Medicine

## 2018-06-10 ENCOUNTER — Other Ambulatory Visit: Payer: Self-pay | Admitting: Internal Medicine

## 2018-06-28 ENCOUNTER — Telehealth: Payer: Self-pay

## 2018-06-28 NOTE — Telephone Encounter (Signed)
Pt has not set up voicemail- mychart message sent

## 2018-07-26 ENCOUNTER — Telehealth: Payer: Self-pay | Admitting: Internal Medicine

## 2018-07-26 ENCOUNTER — Encounter: Payer: Self-pay | Admitting: *Deleted

## 2018-07-26 NOTE — Telephone Encounter (Signed)
Called and spoke with patient she is aware and verbalized understanding. Patient will drop form off tomorrow.

## 2018-07-26 NOTE — Telephone Encounter (Signed)
Yes, she can drop off form

## 2018-07-26 NOTE — Telephone Encounter (Signed)
Called and spoke with patient, she has cancelled her appointment for 07/31/18. Patient stated that she still needs her handicap placard renewed and she has the form from the [facility] patient wants to know if she can bring this in to be signed.   CY please advise, thank you.

## 2018-07-27 ENCOUNTER — Telehealth: Payer: Self-pay | Admitting: Internal Medicine

## 2018-07-27 NOTE — Telephone Encounter (Signed)
Patient came to office to have Dr. [name] sign handicap placard. Her placard expires on 3.31.20.  Dr. [name] has stated he will sign while patient is here, so she does not have to come back.   Nothing further needed at this time.

## 2018-07-31 ENCOUNTER — Ambulatory Visit: Payer: BLUE CROSS/BLUE SHIELD | Admitting: Internal Medicine

## 2018-09-20 ENCOUNTER — Encounter: Payer: Self-pay | Admitting: Internal Medicine

## 2018-09-25 ENCOUNTER — Other Ambulatory Visit: Payer: Self-pay | Admitting: Nurse Practitioner

## 2018-10-09 ENCOUNTER — Ambulatory Visit: Payer: Self-pay | Admitting: Internal Medicine

## 2018-11-22 ENCOUNTER — Other Ambulatory Visit: Payer: Self-pay

## 2018-11-22 ENCOUNTER — Encounter: Payer: Self-pay | Admitting: Internal Medicine

## 2018-11-22 ENCOUNTER — Ambulatory Visit (INDEPENDENT_AMBULATORY_CARE_PROVIDER_SITE_OTHER): Payer: BC Managed Care – PPO | Admitting: Internal Medicine

## 2018-11-22 VITALS — BP 120/74 | HR 87 | Temp 98.2°F | Ht 65.0 in | Wt 301.0 lb

## 2018-11-22 DIAGNOSIS — G4733 Obstructive sleep apnea (adult) (pediatric): Secondary | ICD-10-CM | POA: Diagnosis not present

## 2018-11-22 NOTE — Progress Notes (Signed)
Patient ID: [name] [name], female    DOB: 1971-01-06, 48 y.o.   MRN: [id number]  HPI female never smoker followed for OSA, asthma, obesity/hypoventilation (previous gastric bypass bariatric surgery), complicated by morbid obesity NPSG 04/21/03  AHI 101/ hr, CPAP to 18, weight was 400 lbs --------------------------------------------------------------------------------------------------  07/28/17- 48 year old female never smoker followed for OSA, asthma, obesity/hypoventilation (previous gastric bypass bariatric surgery), complicated by morbid obesity CPAP 9/[facility]>> new DME, replace machine, auto 5-12 ----OSA: DME Pt states she is not with [facility] and no one has taken her on as a patient for supplies. Will need to order new DME today Pt wears CPAP nightly DL attached.  Compliance has been good and she feels much better sleeping with CPAP.  Machine is at least 50 years old. Began sneezing with lawnmowing this week-spring pollen allergy.  11/22/2018- 48 year old female never smoker followed for OSA, asthma, obesity/hypoventilation (previous gastric bypass bariatric surgery), complicated by morbid obesity -----OSA not currently on CPAP d/t financial issues, DME: [employer]; states she would like to be reestablished onto CPAP. Now has insurance again.  Body weight today 301 lbs Understands we need to update sleep study.  Review of Systems + = positive Constitutional:   No-   weight loss, night sweats, fevers, chills, fatigue, lassitude. HEENT:   No-  headaches, difficulty swallowing, tooth/dental problems, sore throat,       Occasional  sneezing, itching, ear ache, nasal congestion, post nasal drip,  CV:  No-   chest pain, orthopnea, PND, swelling in lower extremities, anasarca, dizziness, palpitations Resp: No- acute  shortness of breath with exertion or at rest.              No-   productive cough,  +non-productive cough,  No- coughing up of blood.              No-   change in color of mucus.  No-  wheezing.   Skin: No-   rash or lesions. GI:  No-   heartburn, indigestion, abdominal pain, nausea, vomiting,  GU:  MS:  No-   joint pain or swelling.   Neuro-     nothing unusual Psych:  No- change in mood or affect. No depression or anxiety.  No memory loss.    Objective:   Physical Exam General- Alert, Oriented, Affect-appropriate, Distress- none acute, + Morbidly obese Skin- rash-none, lesions- none, excoriation- none Lymphadenopathy- none Head- atraumatic            Eyes- Gross vision intact, PERRLA, conjunctivae clear secretions            Ears- Hearing, canals normal            Nose- Clear, No- Septal dev, mucus, polyps, erosion, perforation             Throat- Mallampati II-III , mucosa clear , drainage- none, tonsils- atrophic Neck- flexible , trachea midline, no stridor , thyroid nl, carotid no bruit Chest - symmetrical excursion , unlabored           Heart/CV- RRR , no murmur , no gallop  , no rub, nl s1 s2                           - JVD- none , edema- none, stasis changes- none, varices- none           Lung- clear to P&A, wheeze- none,  dullness-none, rub- none  Chest wall-  Abd- Br/ Gen/ Rectal- Not done, not indicated Extrem- cyanosis- none, clubbing, none, atrophy- none, strength- nl Neuro- grossly intact to observation

## 2018-11-22 NOTE — Patient Instructions (Signed)
Order- schedule unattended home sleep test   Dx OSA  Please call us about 2 weeks after your sleep test to see if results and recommendations are ready yet. If appropriate, we will be able to go ahead and get you a new CPAP machine before we see you nest.

## 2018-11-23 ENCOUNTER — Encounter: Payer: Self-pay | Admitting: Podiatry

## 2018-11-23 ENCOUNTER — Ambulatory Visit (INDEPENDENT_AMBULATORY_CARE_PROVIDER_SITE_OTHER): Payer: BC Managed Care – PPO

## 2018-11-23 ENCOUNTER — Ambulatory Visit: Payer: BC Managed Care – PPO | Admitting: Podiatry

## 2018-11-23 VITALS — Temp 98.0°F

## 2018-11-23 DIAGNOSIS — M722 Plantar fascial fibromatosis: Secondary | ICD-10-CM

## 2018-11-23 DIAGNOSIS — B351 Tinea unguium: Secondary | ICD-10-CM

## 2018-11-23 MED ORDER — TERBINAFINE HCL 250 MG PO TABS: 250.0000 mg | ORAL_TABLET | Freq: Every day | ORAL | 0 refills | Status: DC

## 2018-11-23 NOTE — Progress Notes (Signed)
Subjective:   Patient ID: [name] [name], female   DOB: 48 y.o.   MRN: [id number]   HPI Patient presents with severe flatfoot deformity with history of orthotics which are no longer effective and also has nail disease bilateral that is thick and yellow with brittle debris.  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       Objective:  Physical Exam Vitals signs and nursing note reviewed.  Constitutional:      Appearance: She is well-developed.  Pulmonary:     Effort: Pulmonary effort is normal.  Musculoskeletal: Normal range of motion.  Skin:    General: Skin is warm.  Neurological:     Mental Status: She is alert.     Neurovascular status intact muscle strength is adequate range of motion within normal limits with patient found to have flatfoot deformity bilateral with inflammation pain along the posterior tibial tendon bilateral and mild plantar fascial symptomatology.  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  Today I recommended orthotics and I discussed orthotics with patient and she is scanned for customized orthotic devices and I debrided nailbeds and then recommended oral Lamisil and I am sending for liver function will begin to do 50 mg daily for 90 days and I did educate her on fall complications.  Patient to be seen back  X-ray indicates depression of the arch with moderate signs of arthritic issues occurring bilateral

## 2018-11-24 LAB — HEPATIC FUNCTION PANEL
AG Ratio: 1.3 (calc) (ref 1.0–2.5)
ALT: 12 U/L (ref 6–29)
AST: 17 U/L (ref 10–35)
Albumin: 4 g/dL (ref 3.6–5.1)
Alkaline phosphatase (APISO): 124 U/L (ref 37–153)
Bilirubin, Direct: 0.1 mg/dL (ref 0.0–0.2)
Globulin: 3 g/dL (calc) (ref 1.9–3.7)
Indirect Bilirubin: 0.2 mg/dL (calc) (ref 0.2–1.2)
Total Bilirubin: 0.3 mg/dL (ref 0.2–1.2)
Total Protein: 7 g/dL (ref 6.1–8.1)

## 2018-12-04 ENCOUNTER — Telehealth: Payer: Self-pay | Admitting: Internal Medicine

## 2018-12-04 NOTE — Telephone Encounter (Signed)
I just called the patient back to try and schedule the HST but I had to LVM again

## 2018-12-04 NOTE — Telephone Encounter (Signed)
Rodena Piety called pt.  I made her aware of phone note.

## 2018-12-04 NOTE — Telephone Encounter (Signed)
[facility] did anyone call patient? I see order for sleep study.

## 2018-12-05 ENCOUNTER — Ambulatory Visit: Payer: BC Managed Care – PPO | Admitting: Internal Medicine

## 2018-12-05 ENCOUNTER — Other Ambulatory Visit: Payer: Self-pay

## 2018-12-05 ENCOUNTER — Encounter: Payer: Self-pay | Admitting: Internal Medicine

## 2018-12-05 VITALS — BP 120/86 | HR 83 | Temp 98.3°F | Ht 63.6 in | Wt 298.6 lb

## 2018-12-05 DIAGNOSIS — F3177 Bipolar disorder, in partial remission, most recent episode mixed: Secondary | ICD-10-CM

## 2018-12-05 DIAGNOSIS — Z0000 Encounter for general adult medical examination without abnormal findings: Secondary | ICD-10-CM | POA: Diagnosis not present

## 2018-12-05 DIAGNOSIS — Z6841 Body Mass Index (BMI) 40.0 and over, adult: Secondary | ICD-10-CM

## 2018-12-05 DIAGNOSIS — Z1211 Encounter for screening for malignant neoplasm of colon: Secondary | ICD-10-CM

## 2018-12-05 DIAGNOSIS — E66813 Obesity, class 3: Secondary | ICD-10-CM

## 2018-12-05 LAB — POCT URINALYSIS DIPSTICK
Blood, UA: NEGATIVE
Glucose, UA: NEGATIVE
Ketones, UA: NEGATIVE
Leukocytes, UA: NEGATIVE
Nitrite, UA: NEGATIVE
Protein, UA: NEGATIVE
Spec Grav, UA: 1.025 (ref 1.010–1.025)
Urobilinogen, UA: 0.2 E.U./dL
pH, UA: 5.5 (ref 5.0–8.0)

## 2018-12-05 NOTE — Patient Instructions (Signed)
Health Maintenance, Female Adopting a healthy lifestyle and getting preventive care are important in promoting health and wellness. Ask your health care provider about:  The right schedule for you to have regular tests and exams.  Things you can do on your own to prevent diseases and keep yourself healthy. What should I know about diet, weight, and exercise? Eat a healthy diet   Eat a diet that includes plenty of vegetables, fruits, low-fat dairy products, and lean protein.  Do not eat a lot of foods that are high in solid fats, added sugars, or sodium. Maintain a healthy weight Body mass index (BMI) is used to identify weight problems. It estimates body fat based on height and weight. Your health care provider can help determine your BMI and help you achieve or maintain a healthy weight. Get regular exercise Get regular exercise. This is one of the most important things you can do for your health. Most adults should:  Exercise for at least 150 minutes each week. The exercise should increase your heart rate and make you sweat (moderate-intensity exercise).  Do strengthening exercises at least twice a week. This is in addition to the moderate-intensity exercise.  Spend less time sitting. Even light physical activity can be beneficial. Watch cholesterol and blood lipids Have your blood tested for lipids and cholesterol at 50 years of age, then have this test every 5 years. Have your cholesterol levels checked more often if:  Your lipid or cholesterol levels are high.  You are older than 50 years of age.  You are at high risk for heart disease. What should I know about cancer screening? Depending on your health history and family history, you may need to have cancer screening at various ages. This may include screening for:  Breast cancer.  Cervical cancer.  Colorectal cancer.  Skin cancer.  Lung cancer. What should I know about heart disease, diabetes, and high blood  pressure? Blood pressure and heart disease  High blood pressure causes heart disease and increases the risk of stroke. This is more likely to develop in people who have high blood pressure readings, are of African descent, or are overweight.  Have your blood pressure checked: ? Every 3-5 years if you are 18-39 years of age. ? Every year if you are 40 years old or older. Diabetes Have regular diabetes screenings. This checks your fasting blood sugar level. Have the screening done:  Once every three years after age 40 if you are at a normal weight and have a low risk for diabetes.  More often and at a younger age if you are overweight or have a high risk for diabetes. What should I know about preventing infection? Hepatitis B If you have a higher risk for hepatitis B, you should be screened for this virus. Talk with your health care provider to find out if you are at risk for hepatitis B infection. Hepatitis C Testing is recommended for:  Everyone born from 1945 through 1965.  Anyone with known risk factors for hepatitis C. Sexually transmitted infections (STIs)  Get screened for STIs, including gonorrhea and chlamydia, if: ? You are sexually active and are younger than 50 years of age. ? You are older than 50 years of age and your health care provider tells you that you are at risk for this type of infection. ? Your sexual activity has changed since you were last screened, and you are at increased risk for chlamydia or gonorrhea. Ask your health care provider if   you are at risk.  Ask your health care provider about whether you are at high risk for HIV. Your health care provider may recommend a prescription medicine to help prevent HIV infection. If you choose to take medicine to prevent HIV, you should first get tested for HIV. You should then be tested every 3 months for as long as you are taking the medicine. Pregnancy  If you are about to stop having your period (premenopausal) and  you may become pregnant, seek counseling before you get pregnant.  Take 400 to 800 micrograms (mcg) of folic acid every day if you become pregnant.  Ask for birth control (contraception) if you want to prevent pregnancy. Osteoporosis and menopause Osteoporosis is a disease in which the bones lose minerals and strength with aging. This can result in bone fractures. If you are 65 years old or older, or if you are at risk for osteoporosis and fractures, ask your health care provider if you should:  Be screened for bone loss.  Take a calcium or vitamin D supplement to lower your risk of fractures.  Be given hormone replacement therapy (HRT) to treat symptoms of menopause. Follow these instructions at home: Lifestyle  Do not use any products that contain nicotine or tobacco, such as cigarettes, e-cigarettes, and chewing tobacco. If you need help quitting, ask your health care provider.  Do not use street drugs.  Do not share needles.  Ask your health care provider for help if you need support or information about quitting drugs. Alcohol use  Do not drink alcohol if: ? Your health care provider tells you not to drink. ? You are pregnant, may be pregnant, or are planning to become pregnant.  If you drink alcohol: ? Limit how much you use to 0-1 drink a day. ? Limit intake if you are breastfeeding.  Be aware of how much alcohol is in your drink. In the [place] one drink equals one 12 oz bottle of beer (355 mL), one 5 oz glass of wine (148 mL), or one 1 oz glass of hard liquor (44 mL). General instructions  Schedule regular health, dental, and eye exams.  Stay current with your vaccines.  Tell your health care provider if: ? You often feel depressed. ? You have ever been abused or do not feel safe at home. Summary  Adopting a healthy lifestyle and getting preventive care are important in promoting health and wellness.  Follow your health care provider's instructions about healthy  diet, exercising, and getting tested or screened for diseases.  Follow your health care provider's instructions on monitoring your cholesterol and blood pressure. This information is not intended to replace advice given to you by your health care provider. Make sure you discuss any questions you have with your health care provider. Document Released: 11/02/2010 Document Revised: 04/12/2018 Document Reviewed: 04/12/2018 Elsevier Patient Education  2020 Elsevier Inc.  

## 2018-12-05 NOTE — Telephone Encounter (Signed)
Patient finally called me back and she is scheduled to pick up HST machine on 12/11/2018 @ 9:00am

## 2018-12-05 NOTE — Progress Notes (Signed)
Subjective:     Patient ID: [name] [name] , female    DOB: [birth year] , 48 y.o.   MRN: [id number]   Chief Complaint  Patient presents with  . Annual Exam    HPI  She is here today for a full physical examination. She does not wish to have a pap smear today. She admits that she has not seen her GYN in "years".  Pt advised that she is due for pelvic exam/pap smear. She declines at this time.     Past Medical History:  Diagnosis Date  . Acute pulmonary edema ([place]) 07/01/2003  . Allergic rhinitis   . Chronic respiratory failure ([place]) 07/01/2003  . Iron deficiency anemia    d/t menometrorrhagia  . Morbid obesity ([place])    bariatric surgery  . OSA (obstructive sleep apnea) 04/21/2003     Family History  Problem Relation Age of Onset  . Asthma Father      Current Outpatient Medications:  .  buPROPion (WELLBUTRIN) 100 MG tablet, 100 mg 3 (three) times daily. , Disp: , Rfl:  .  clindamycin (CLEOCIN T) 1 % lotion, Apply topically 2 (two) times daily., Disp: , Rfl:  .  escitalopram (LEXAPRO) 20 MG tablet, Take 20 mg by mouth daily., Disp: , Rfl:  .  fluocinolone (SYNALAR) 0.01 % external solution, , Disp: , Rfl: 0 .  meloxicam (MOBIC) 7.5 MG tablet, Take 15 mg by mouth daily. , Disp: , Rfl: 2 .  terbinafine (LAMISIL) 250 MG tablet, Take 1 tablet (250 mg total) by mouth daily., Disp: 90 tablet, Rfl: 0 .  topiramate (TOPAMAX) 100 MG tablet, , Disp: , Rfl:  .  tretinoin (RETIN-A) 0.025 % cream, Apply topically at bedtime., Disp: , Rfl:  .  VYVANSE 70 MG capsule, TK 1  C PO QAM, Disp: , Rfl: 0   Allergies  Allergen Reactions  . Amoxicillin Other (See Comments)      Review of Systems  Constitutional: Negative.   HENT: Negative.   Eyes: Negative.   Respiratory: Negative.   Cardiovascular: Negative.   Endocrine: Negative.   Genitourinary: Negative.   Musculoskeletal: Negative.   Skin: Negative.   Allergic/Immunologic: Negative.   Neurological: Negative.   Hematological:  Negative.   Psychiatric/Behavioral: Negative.      Today's Vitals   12/05/18 1414  BP: 120/86  Pulse: 83  Temp: 98.3 F (36.8 C)  TempSrc: Oral  Weight: 298 lb 9.6 oz (135.4 kg)  Height: 5' 3.6" (1.615 m)   Body mass index is 51.9 kg/m.   Objective:  Physical Exam Vitals signs and nursing note reviewed.  Constitutional:      Appearance: Normal appearance. She is obese.  HENT:     Head: Normocephalic and atraumatic.     Right Ear: Tympanic membrane, ear canal and external ear normal.     Left Ear: Tympanic membrane, ear canal and external ear normal.     Nose: Nose normal.     Mouth/Throat:     Mouth: Mucous membranes are moist.     Pharynx: Oropharynx is clear.  Eyes:     Extraocular Movements: Extraocular movements intact.     Conjunctiva/sclera: Conjunctivae normal.     Pupils: Pupils are equal, round, and reactive to light.  Neck:     Musculoskeletal: Normal range of motion and neck supple.  Cardiovascular:     Rate and Rhythm: Normal rate and regular rhythm.     Pulses: Normal pulses.     Heart sounds:  Normal heart sounds.  Pulmonary:     Effort: Pulmonary effort is normal.     Breath sounds: Normal breath sounds.  Chest:     Breasts: Tanner Score is 5.        Right: Normal. No swelling, bleeding, inverted nipple, mass or nipple discharge.        Left: Normal. No swelling, bleeding, inverted nipple, mass or nipple discharge.     Comments: Healed surgical scars Abdominal:     General: Abdomen is protuberant. Bowel sounds are normal.     Palpations: Abdomen is soft.     Comments: Obese, difficult to assess organomegaly.  Genitourinary:    Comments: deferred Musculoskeletal: Normal range of motion.  Skin:    General: Skin is warm and dry.  Neurological:     General: No focal deficit present.     Mental Status: She is alert and oriented to person, place, and time.  Psychiatric:        Mood and Affect: Mood normal.        Behavior: Behavior normal.          Assessment And Plan:     1. Routine general medical examination at health care facility  A full exam was performed. Importance of monthly self breast exams was discussed with the patient. PATIENT HAS BEEN ADVISED TO GET 30-45 MINUTES REGULAR EXERCISE NO LESS THAN FOUR TO FIVE DAYS PER WEEK - BOTH WEIGHTBEARING EXERCISES AND AEROBIC ARE RECOMMENDED.  SHE WAS ADVISED TO FOLLOW A HEALTHY DIET WITH AT LEAST SIX FRUITS/VEGGIES PER DAY, DECREASE INTAKE OF RED MEAT, AND TO INCREASE FISH INTAKE TO TWO DAYS PER WEEK.  MEATS/FISH SHOULD NOT BE FRIED, BAKED OR BROILED IS PREFERABLE.  I SUGGEST WEARING SPF 50 SUNSCREEN ON EXPOSED PARTS AND ESPECIALLY WHEN IN THE DIRECT SUNLIGHT FOR AN EXTENDED PERIOD OF TIME.  PLEASE AVOID FAST FOOD RESTAURANTS AND INCREASE YOUR WATER INTAKE.  - CBC - Lipid panel - Hemoglobin A1c - BMP8+EGFR - TSH - POCT Urinalysis Dipstick (81002)  2. Bipolar disorder, in partial remission, most recent episode mixed (HCC)  Chronic, also followed by Psychiatry. Importance of medication compliance was discussed with the patient. She is also encouraged to include more whole foods into her diet.   3. Special screening for malignant neoplasm of colon  - Ambulatory referral to Gastroenterology  4. Class 3 severe obesity due to excess calories with serious comorbidity and body mass index (BMI) of 50.0 to 59.9 in adult [facility])  Importance of achieving optimal weight to decrease risk of cardiovascular disease and cancers was discussed with the patient in full detail. She is encouraged to start slowly - start with 10 minutes twice daily at least three to four days per week and to gradually build to 30 minutes five days weekly. She was given tips to incorporate more activity into her daily routine - take stairs when possible, park farther away from her job, grocery stores, etc.    Maximino Greenland, MD    THE PATIENT IS ENCOURAGED TO PRACTICE SOCIAL DISTANCING DUE TO THE COVID-19  PANDEMIC.

## 2018-12-06 LAB — LIPID PANEL
Chol/HDL Ratio: 2.6 ratio (ref 0.0–4.4)
Cholesterol, Total: 185 mg/dL (ref 100–199)
HDL: 72 mg/dL (ref >39)
LDL Calculated: 97 mg/dL (ref 0–99)
Triglycerides: 79 mg/dL (ref 0–149)
VLDL Cholesterol Cal: 16 mg/dL (ref 5–40)

## 2018-12-06 LAB — CBC
Hematocrit: 41 % (ref 34.0–46.6)
Hemoglobin: 12.5 g/dL (ref 11.1–15.9)
MCH: 26.6 pg (ref 26.6–33.0)
MCHC: 30.5 g/dL — ABNORMAL LOW (ref 31.5–35.7)
MCV: 87 fL (ref 79–97)
Platelets: 440 10*3/uL (ref 150–450)
RBC: 4.7 x10E6/uL (ref 3.77–5.28)
RDW: 15 % (ref 11.7–15.4)
WBC: 7.7 10*3/uL (ref 3.4–10.8)

## 2018-12-06 LAB — HEMOGLOBIN A1C
Est. average glucose Bld gHb Est-mCnc: 117 mg/dL
Hgb A1c MFr Bld: 5.7 % — ABNORMAL HIGH (ref 4.8–5.6)

## 2018-12-06 LAB — TSH: TSH: 4.32 u[IU]/mL (ref 0.450–4.500)

## 2018-12-06 LAB — BMP8+EGFR
BUN/Creatinine Ratio: 22 (ref 9–23)
BUN: 15 mg/dL (ref 6–24)
CO2: 22 mmol/L (ref 20–29)
Calcium: 9.5 mg/dL (ref 8.7–10.2)
Chloride: 102 mmol/L (ref 96–106)
Creatinine, Ser: 0.69 mg/dL (ref 0.57–1.00)
GFR calc Af Amer: 117 mL/min/{1.73_m2} (ref >59)
GFR calc non Af Amer: 102 mL/min/{1.73_m2} (ref >59)
Glucose: 107 mg/dL — ABNORMAL HIGH (ref 65–99)
Potassium: 4.2 mmol/L (ref 3.5–5.2)
Sodium: 138 mmol/L (ref 134–144)

## 2018-12-11 ENCOUNTER — Other Ambulatory Visit: Payer: Self-pay

## 2018-12-11 ENCOUNTER — Ambulatory Visit: Payer: BC Managed Care – PPO

## 2018-12-11 DIAGNOSIS — G4733 Obstructive sleep apnea (adult) (pediatric): Secondary | ICD-10-CM

## 2018-12-14 ENCOUNTER — Other Ambulatory Visit: Payer: BC Managed Care – PPO | Admitting: Orthotics

## 2018-12-15 DIAGNOSIS — G4733 Obstructive sleep apnea (adult) (pediatric): Secondary | ICD-10-CM

## 2018-12-20 ENCOUNTER — Other Ambulatory Visit: Payer: BC Managed Care – PPO | Admitting: Orthotics

## 2018-12-20 ENCOUNTER — Other Ambulatory Visit: Payer: Self-pay

## 2019-01-09 NOTE — Assessment & Plan Note (Signed)
Despite hx bariatric surgery, she has been unable to get her weight under control. Continue to provide support and encouragement.

## 2019-01-09 NOTE — Assessment & Plan Note (Signed)
Slept much better with CPAP before. Motivated to restart now that she has coverage. Plan- sleep study, then order CPAP

## 2019-02-20 ENCOUNTER — Telehealth: Payer: Self-pay | Admitting: Internal Medicine

## 2019-02-20 NOTE — Telephone Encounter (Signed)
Her home sleep test on 8/10 showed mild sleep apnea, averaging 6.3 apneas/ hour, with drops in blood oxygen level. When we had last spoken she was interested in restarting CPAP.  With scores in her range, we can wait to discuss options at next visit, or we can go ahead and   order new DME, new CPAP auto 5-15, mask of choice, humidifier, supplies, AirView/ card I will need to see her again in 31-90 days per insurance rules if she starts CPAP.

## 2019-02-20 NOTE — Telephone Encounter (Signed)
ATC pt, line went to voicemail. LMTCB x1. 

## 2019-02-20 NOTE — Telephone Encounter (Signed)
Called and spoke with Patient.  Patient requested home sleep study results, if possible. Patient sleep study was 12/11/18.  Message routed to Dr. Annamaria Boots  Allergies  Allergen Reactions  . Amoxicillin Other (See Comments)   Current Outpatient Medications on File Prior to Visit  Medication Sig Dispense Refill  . buPROPion (WELLBUTRIN) 100 MG tablet 100 mg 3 (three) times daily.     . clindamycin (CLEOCIN T) 1 % lotion Apply topically 2 (two) times daily.    Marland Kitchen escitalopram (LEXAPRO) 20 MG tablet Take 20 mg by mouth daily.    . fluocinolone (SYNALAR) 0.01 % external solution   0  . meloxicam (MOBIC) 7.5 MG tablet Take 15 mg by mouth daily.   2  . terbinafine (LAMISIL) 250 MG tablet Take 1 tablet (250 mg total) by mouth daily. 90 tablet 0  . topiramate (TOPAMAX) 100 MG tablet     . tretinoin (RETIN-A) 0.025 % cream Apply topically at bedtime.    Marland Kitchen VYVANSE 70 MG capsule TK 1  C PO QAM  0   No current facility-administered medications on file prior to visit.

## 2019-02-21 NOTE — Telephone Encounter (Signed)
ATC pt, line went to voicemail. LMTCB x2.  

## 2019-02-22 ENCOUNTER — Telehealth: Payer: Self-pay | Admitting: Internal Medicine

## 2019-02-22 DIAGNOSIS — G4733 Obstructive sleep apnea (adult) (pediatric): Secondary | ICD-10-CM

## 2019-02-22 NOTE — Telephone Encounter (Signed)
Ok to cancel the November appointment. We will see her as planned for CPAP f/u.

## 2019-02-22 NOTE — Telephone Encounter (Signed)
Noted. November appt has been cancelled. Nothing further needed at this time.

## 2019-02-22 NOTE — Telephone Encounter (Signed)
LMTCB x3 for pt. We have attempted to contact pt several times with no success or call back from pt. Per triage protocol, message will be closed.   

## 2019-02-22 NOTE — Telephone Encounter (Signed)
Call returned to patient, confirmed DOB, requesting results of HST:  Deneise Lever, MD to Annie Paras D, LPN   Note   Her home sleep test on 8/10 showed mild sleep apnea, averaging 6.3 apneas/ hour, with drops in blood oxygen level. When we had last spoken she was interested in restarting CPAP.  With scores in her range, we can wait to discuss options at next visit, or we can go ahead and   order new DME, new CPAP auto 5-15, mask of choice, humidifier, supplies, AirView/ card I will need to see her again in 31-90 days per insurance rules if she starts CPAP.      Made aware of results. Voiced understanding. Patient would like to go ahead and get order for cpap sent.   31-90 day appt made.   CY okay to cancel patient appt 11/06 if she does not need anything addressed? Thanks.

## 2019-03-09 ENCOUNTER — Ambulatory Visit: Payer: BC Managed Care – PPO | Admitting: Internal Medicine

## 2019-03-13 ENCOUNTER — Telehealth: Payer: Self-pay

## 2019-03-13 ENCOUNTER — Ambulatory Visit: Payer: BC Managed Care – PPO | Admitting: Internal Medicine

## 2019-03-13 NOTE — Telephone Encounter (Signed)
PT LVM TO CANCEL ON 11/9 SPOKE WITH PT TODAY STATED THAT SHE WILL CALL BACK TO SCHEDULE

## 2019-04-03 ENCOUNTER — Telehealth: Payer: Self-pay | Admitting: *Deleted

## 2019-04-03 NOTE — Telephone Encounter (Signed)
Received a fax from patient's pharmacy at [place], [place], [place] a request to get a refill for Terbinafine HCL 250 mg.  I declined request because patient needs to make an appointment with Dr. Paulla Dolly for future refills.

## 2019-04-17 ENCOUNTER — Ambulatory Visit: Payer: BC Managed Care – PPO | Admitting: Internal Medicine

## 2019-05-25 ENCOUNTER — Encounter: Payer: Self-pay | Admitting: Internal Medicine

## 2019-05-25 ENCOUNTER — Telehealth: Payer: Self-pay | Admitting: Internal Medicine

## 2019-05-25 MED ORDER — PNEUMOCOCCAL VAC POLYVALENT 25 MCG/0.5ML IJ INJ: 0.5000 mL | INJECTION | Freq: Once | INTRAMUSCULAR | 0 refills | Status: AC

## 2019-05-25 NOTE — Telephone Encounter (Signed)
email received this morning  "Can you please call in prescription [facility] on [address] [phone]. The technician stated since I'm under age 49 I need prescription. My last shot was 2007 it the pneumococcal 23. I do not plan receiving covit vaccine any time soon. "  Dr. [name] please advise

## 2019-05-25 NOTE — Telephone Encounter (Signed)
Pneumovax-23 vaccine sent to pharmacy that patient requested and patient made aware.

## 2019-05-25 NOTE — Telephone Encounter (Signed)
Ok to send order for pneumovax pneumonia vaccine

## 2019-05-30 NOTE — Progress Notes (Addendum)
Virtual Visit via Telephone Note  I connected with [name] [name] on 05/31/19 at 10:00 AM EST by telephone and verified that I am speaking with the correct person using two identifiers.  Location: Patient: Home Provider: Office [facility] - [address] [place], [address], [place], [place] [address]   I discussed the limitations, risks, security and privacy concerns of performing an evaluation and management service by telephone and the availability of in person appointments. I also discussed with the patient that there may be a patient responsible charge related to this service. The patient expressed understanding and agreed to proceed.  Patient consented to consult via telephone: Yes People present and their role in pt care: Pt    History of Present Illness:  49 year old female never smoker followed in our office for obstructive sleep apnea, asthma  Past medical history: Morbid obesity, status post bariatric surgery Smoking history: Never smoker Maintenance: None Patient of Dr. 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  Patient reports that her symptoms started on 05/25/2019.  She reports a dry cough, shortness of breath, fatigue, loss of smell and taste.  She denies any fevers.  She reports that she obtain [facility] on [address] yesterday 05/30/2019.  She reports the cough is persistent and bothersome.  She has occasional wheezing.  Based off of BMI she would be candidate for the monoclonal antibody infusion if we received a test result within the 10-day timeframe of symptom onset.   Observations/Objective:  NPSG 04/21/03  AHI 101/ hr, CPAP to 18, weight  was 400 lbs  Social History   Tobacco Use  Smoking Status Never Smoker  Smokeless Tobacco Never Used   Immunization History  Administered Date(s) Administered  . Influenza Inj Mdck Quad Pf 01/12/2019  . Influenza Split 03/06/2012, 01/31/2013  . Influenza Whole 02/23/2006, 03/12/2009, 03/03/2010  . Influenza,inj,Quad PF,6+ Mos 01/20/2017, 04/30/2018  . Influenza-Unspecified 03/25/2012, 03/05/2013, 03/03/2014  . Pneumococcal Polysaccharide-23 04/22/2006    Assessment and Plan:  Obstructive sleep apnea We will further assess at follow-up visits Continue CPAP therapy  Suspected COVID-19 virus infection Likely Covid infection Symptom onset 05/25/2019 Pending [facility] test [facility]/30/2019  Plan: We will coordinate in person evaluation at respiratory clinic on 05/31/2019, patient would likely benefit from a chest x-ray Could consider monoclonal antibody infusion for patient once test results are received   Addendum: 05/31/2019 Patient scheduled for the respiratory clinic off of [facility] tonight at 6 PM  Follow Up Instructions:  Return in about 4 weeks (around 06/28/2019), or if symptoms worsen or fail to improve, for Follow up with [name] [name], Follow up with Dr. Maple [name].   I discussed the assessment and treatment plan with the patient. The patient was provided an opportunity to ask questions and all were answered. The patient agreed with the plan and demonstrated an understanding of the instructions.   The patient was advised to call back or seek an in-person evaluation if the symptoms worsen or if the condition fails to improve as anticipated.  I provided 23 minutes of non-face-to-face time during this encounter.   [name] Ceo, NP

## 2019-05-31 ENCOUNTER — Other Ambulatory Visit: Payer: Self-pay

## 2019-05-31 ENCOUNTER — Ambulatory Visit (INDEPENDENT_AMBULATORY_CARE_PROVIDER_SITE_OTHER): Payer: BC Managed Care – PPO | Admitting: Internal Medicine

## 2019-05-31 ENCOUNTER — Inpatient Hospital Stay (HOSPITAL_COMMUNITY)
Admission: EM | Admit: 2019-05-31 | Discharge: 2019-06-05 | DRG: 177 | Disposition: A | Discharge status: Home / Self Care | Payer: BC Managed Care – PPO | Attending: Family Medicine | Admitting: Family Medicine

## 2019-05-31 ENCOUNTER — Encounter: Payer: Self-pay | Admitting: Pulmonary Disease

## 2019-05-31 ENCOUNTER — Emergency Department (HOSPITAL_COMMUNITY): Payer: BC Managed Care – PPO

## 2019-05-31 ENCOUNTER — Ambulatory Visit (INDEPENDENT_AMBULATORY_CARE_PROVIDER_SITE_OTHER): Payer: BC Managed Care – PPO | Admitting: Pulmonary Disease

## 2019-05-31 VITALS — BP 130/90 | HR 104 | Temp 98.5°F | Resp 22 | Ht 63.0 in | Wt 317.0 lb

## 2019-05-31 DIAGNOSIS — J309 Allergic rhinitis, unspecified: Secondary | ICD-10-CM | POA: Diagnosis present

## 2019-05-31 DIAGNOSIS — R432 Parageusia: Secondary | ICD-10-CM

## 2019-05-31 DIAGNOSIS — R0603 Acute respiratory distress: Secondary | ICD-10-CM

## 2019-05-31 DIAGNOSIS — J45909 Unspecified asthma, uncomplicated: Secondary | ICD-10-CM | POA: Diagnosis present

## 2019-05-31 DIAGNOSIS — Z9884 Bariatric surgery status: Secondary | ICD-10-CM

## 2019-05-31 DIAGNOSIS — F3177 Bipolar disorder, in partial remission, most recent episode mixed: Secondary | ICD-10-CM | POA: Diagnosis present

## 2019-05-31 DIAGNOSIS — J9601 Acute respiratory failure with hypoxia: Secondary | ICD-10-CM | POA: Diagnosis not present

## 2019-05-31 DIAGNOSIS — R0902 Hypoxemia: Secondary | ICD-10-CM | POA: Diagnosis not present

## 2019-05-31 DIAGNOSIS — Z20822 Contact with and (suspected) exposure to covid-19: Secondary | ICD-10-CM

## 2019-05-31 DIAGNOSIS — Z6841 Body Mass Index (BMI) 40.0 and over, adult: Secondary | ICD-10-CM

## 2019-05-31 DIAGNOSIS — U071 COVID-19: Principal | ICD-10-CM | POA: Diagnosis present

## 2019-05-31 DIAGNOSIS — R430 Anosmia: Secondary | ICD-10-CM

## 2019-05-31 DIAGNOSIS — G4733 Obstructive sleep apnea (adult) (pediatric): Secondary | ICD-10-CM | POA: Diagnosis not present

## 2019-05-31 DIAGNOSIS — Z881 Allergy status to other antibiotic agents status: Secondary | ICD-10-CM

## 2019-05-31 DIAGNOSIS — J1282 Pneumonia due to coronavirus disease 2019: Secondary | ICD-10-CM | POA: Diagnosis present

## 2019-05-31 DIAGNOSIS — J9621 Acute and chronic respiratory failure with hypoxia: Secondary | ICD-10-CM | POA: Diagnosis present

## 2019-05-31 DIAGNOSIS — Z825 Family history of asthma and other chronic lower respiratory diseases: Secondary | ICD-10-CM

## 2019-05-31 DIAGNOSIS — Z79899 Other long term (current) drug therapy: Secondary | ICD-10-CM

## 2019-05-31 LAB — COMPREHENSIVE METABOLIC PANEL
ALT: 15 U/L (ref 0–44)
AST: 36 U/L (ref 15–41)
Albumin: 3.1 g/dL — ABNORMAL LOW (ref 3.5–5.0)
Alkaline Phosphatase: 95 U/L (ref 38–126)
Anion gap: 11 (ref 5–15)
BUN: 9 mg/dL (ref 6–20)
CO2: 22 mmol/L (ref 22–32)
Calcium: 8.5 mg/dL — ABNORMAL LOW (ref 8.9–10.3)
Chloride: 105 mmol/L (ref 98–111)
Creatinine, Ser: 0.72 mg/dL (ref 0.44–1.00)
GFR calc Af Amer: >60 mL/min (ref >60)
GFR calc non Af Amer: >60 mL/min (ref >60)
Glucose, Bld: 102 mg/dL — ABNORMAL HIGH (ref 70–99)
Potassium: 4.9 mmol/L (ref 3.5–5.1)
Sodium: 138 mmol/L (ref 135–145)
Total Bilirubin: 1.2 mg/dL (ref 0.3–1.2)
Total Protein: 7.6 g/dL (ref 6.5–8.1)

## 2019-05-31 LAB — CBC WITH DIFFERENTIAL/PLATELET
Abs Immature Granulocytes: 0.36 10*3/uL — ABNORMAL HIGH (ref 0.00–0.07)
Basophils Absolute: 0.1 10*3/uL (ref 0.0–0.1)
Basophils Relative: 1 %
Eosinophils Absolute: 0 10*3/uL (ref 0.0–0.5)
Eosinophils Relative: 0 %
HCT: 34.7 % — ABNORMAL LOW (ref 36.0–46.0)
Hemoglobin: 10.4 g/dL — ABNORMAL LOW (ref 12.0–15.0)
Immature Granulocytes: 4 %
Lymphocytes Relative: 17 %
Lymphs Abs: 1.6 10*3/uL (ref 0.7–4.0)
MCH: 26 pg (ref 26.0–34.0)
MCHC: 30 g/dL (ref 30.0–36.0)
MCV: 86.8 fL (ref 80.0–100.0)
Monocytes Absolute: 0.8 10*3/uL (ref 0.1–1.0)
Monocytes Relative: 8 %
Neutro Abs: 6.6 10*3/uL (ref 1.7–7.7)
Neutrophils Relative %: 70 %
Platelets: 450 10*3/uL — ABNORMAL HIGH (ref 150–400)
RBC: 4 MIL/uL (ref 3.87–5.11)
RDW: 17.2 % — ABNORMAL HIGH (ref 11.5–15.5)
WBC: 9.4 10*3/uL (ref 4.0–10.5)
nRBC: 0.3 % — ABNORMAL HIGH (ref 0.0–0.2)

## 2019-05-31 LAB — TRIGLYCERIDES: Triglycerides: 66 mg/dL (ref <150)

## 2019-05-31 LAB — D-DIMER, QUANTITATIVE: D-Dimer, Quant: 2.68 ug/mL-FEU — ABNORMAL HIGH (ref 0.00–0.50)

## 2019-05-31 LAB — I-STAT BETA HCG BLOOD, ED (MC, WL, AP ONLY): I-stat hCG, quantitative: <5 m[IU]/mL (ref <5)

## 2019-05-31 LAB — FERRITIN: Ferritin: 65 ng/mL (ref 11–307)

## 2019-05-31 LAB — FIBRINOGEN: Fibrinogen: >800 mg/dL — ABNORMAL HIGH (ref 210–475)

## 2019-05-31 LAB — LACTATE DEHYDROGENASE: LDH: 493 U/L — ABNORMAL HIGH (ref 98–192)

## 2019-05-31 LAB — LACTIC ACID, PLASMA: Lactic Acid, Venous: 0.7 mmol/L (ref 0.5–1.9)

## 2019-05-31 LAB — POC SARS CORONAVIRUS 2 AG -  ED: SARS Coronavirus 2 Ag: POSITIVE — AB

## 2019-05-31 LAB — C-REACTIVE PROTEIN: CRP: 20.2 mg/dL — ABNORMAL HIGH (ref <1.0)

## 2019-05-31 MED ORDER — DEXAMETHASONE SODIUM PHOSPHATE 10 MG/ML IJ SOLN
6.0000 mg | Freq: Once | INTRAMUSCULAR | Status: AC
  Administered 2019-06-01: 01:00:00 6 mg via INTRAVENOUS
  Filled 2019-05-31: qty 1

## 2019-05-31 NOTE — Patient Instructions (Signed)
The low oxygen levels puts you at risk for serious complications necessitating transport to the emergency room for evaluation and possible admission.

## 2019-05-31 NOTE — ED Triage Notes (Signed)
Per EMS, patient from [facility] pulmonology, c/o SOB with cough, chills, fever, body aches since 1/22. 100% on 4L [place]. 88% on RA. Hx asthma.

## 2019-05-31 NOTE — ED Provider Notes (Signed)
[facility]-EMERGENCY DEPT Provider Note   CSN: [id number] Arrival date & time: 05/31/19  2007     History Chief Complaint  Patient presents with  . Shortness of Breath    [name] [name] is a 49 y.o. female.  49 yo F with a chief complaint of shortness of breath cough fever loss of smell myalgias.  Going on for about 6 days.  Patient is seen by the pulmonologist for asthma went to see them today and was sent here due to new hypoxia.  Oxygen saturation in the low 80s at rest.  There could also concern for the novel coronavirus.  She had an outpatient test done yesterday but not yet learned the results.  No vomiting or diarrhea no abdominal pain.  The history is provided by the patient.  Shortness of Breath Severity:  Moderate Onset quality:  Gradual Duration:  7 days Timing:  Constant Progression:  Worsening Chronicity:  New Relieved by:  Nothing Worsened by:  Nothing Ineffective treatments:  None tried Associated symptoms: cough and fever   Associated symptoms: no abdominal pain, no chest pain, no headaches, no vomiting and no wheezing        Past Medical History:  Diagnosis Date  . Acute pulmonary edema (HCC) 07/01/2003  . Allergic rhinitis   . Chronic respiratory failure (HCC) 07/01/2003  . Iron deficiency anemia    d/t menometrorrhagia  . Morbid obesity (HCC)    bariatric surgery  . OSA (obstructive sleep apnea) 04/21/2003    Patient Active Problem List   Diagnosis Date Noted  . Loss of smell 05/31/2019  . Suspected COVID-19 virus infection 05/31/2019  . Bipolar disorder, in partial remission, most recent episode mixed (HCC) 05/31/2019  . S/P bariatric surgery 01/20/2017  . Morbid obesity with BMI of 50.0-59.9, adult (HCC) 01/20/2017  . Binge eating 07/31/2015  . Panniculitis 06/03/2011  . Overeating 05/10/2011  . Iron deficiency anemia 05/10/2011  . History of hysterectomy for benign disease 02/05/2011  . 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 OBESITY, MORBID 02/10/2007  . 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 ANEMIA 02/10/2007  . Obstructive sleep apnea 02/10/2007  . Seasonal and perennial allergic rhinitis 02/10/2007  . ASTHMA 02/10/2007    Past Surgical History:  Procedure Laterality Date  . GASTRIC BYPASS  2009  . REDUCTION MAMMAPLASTY    . UTERINE FIBROID SURGERY       OB History   No obstetric history on file.     Family History  Problem Relation Age of Onset  . Asthma Father     Social History   Tobacco Use  . Smoking status: Never Smoker  . Smokeless tobacco: Never Used  Substance Use Topics  . Alcohol use: Yes    Comment: occasionally  . Drug use: No    Home Medications Prior to Admission medications   Medication Sig Start Date End Date Taking? Authorizing Provider  buPROPion (WELLBUTRIN) 100 MG tablet Take 100 mg by mouth 3 (three) times daily.  09/12/18  Yes [provider]  clindamycin (CLEOCIN T) 1 % lotion Apply topically 2 (two) times daily.   Yes [provider]  meloxicam (MOBIC) 7.5 MG tablet Take 15 mg by mouth daily.  08/27/15  Yes [provider]  naproxen sodium (ALEVE) 220 MG tablet Take 220 mg by mouth daily as needed (mild pain).   Yes [provider]  topiramate (TOPAMAX) 100 MG tablet Take 100 mg by mouth daily.  06/30/18  Yes [provider]  tretinoin (RETIN-A) 0.025 % cream Apply topically at bedtime.   Yes [provider]  VYVANSE 70 MG capsule Take 70 mg by mouth daily.  07/17/17  Yes [provider]  terbinafine (LAMISIL) 250 MG tablet Take 1 tablet (250 mg total) by mouth daily. Patient not taking: Reported on 05/31/2019 11/23/18   Lenn Sink, DPM    Allergies    Amoxicillin  Review of Systems   Review of Systems  Constitutional: Positive for chills and fever.  HENT: Negative for congestion and rhinorrhea.   Eyes: Negative for redness and visual disturbance.  Respiratory:  Positive for cough and shortness of breath. Negative for wheezing.   Cardiovascular: Negative for chest pain and palpitations.  Gastrointestinal: Negative for abdominal pain, anal bleeding, nausea and vomiting.  Genitourinary: Negative for dysuria and urgency.  Musculoskeletal: Positive for myalgias. Negative for arthralgias.  Skin: Negative for pallor and wound.  Neurological: Negative for dizziness and headaches.    Physical Exam Updated Vital Signs BP (!) 151/101   Pulse 93   Temp 98.9 F (37.2 C) (Oral)   Resp (!) 26   SpO2 99%   Physical Exam Vitals and nursing note reviewed.  Constitutional:      General: She is not in acute distress.    Appearance: She is well-developed. She is morbidly obese. She is not diaphoretic.  HENT:     Head: Normocephalic and atraumatic.  Eyes:     Pupils: Pupils are equal, round, and reactive to light.  Cardiovascular:     Rate and Rhythm: Normal rate and regular rhythm.     Heart sounds: No murmur. No friction rub. No gallop.   Pulmonary:     Effort: Pulmonary effort is normal. Tachypnea present.     Breath sounds: Decreased breath sounds present. No wheezing or rales.  Abdominal:     General: There is no distension.     Palpations: Abdomen is soft.     Tenderness: There is no abdominal tenderness.  Musculoskeletal:        General: No tenderness.     Cervical back: Normal range of motion and neck supple.  Skin:    General: Skin is warm and dry.  Neurological:     Mental Status: She is alert and oriented to person, place, and time.  Psychiatric:        Behavior: Behavior normal.     ED Results / Procedures / Treatments   Labs (all labs ordered are listed, but only abnormal results are displayed) Labs Reviewed  CBC WITH DIFFERENTIAL/PLATELET - Abnormal; Notable for the following components:      Result Value   Hemoglobin 10.4 (*)    HCT 34.7 (*)    RDW 17.2 (*)    Platelets 450 (*)    nRBC 0.3 (*)    Abs Immature  Granulocytes 0.36 (*)    All other components within normal limits  COMPREHENSIVE METABOLIC PANEL - Abnormal; Notable for the following components:   Glucose, Bld 102 (*)    Calcium 8.5 (*)    Albumin 3.1 (*)    All other components within normal limits  D-DIMER, QUANTITATIVE (NOT AT [facility]) - Abnormal; Notable for the following components:   D-Dimer, Quant 2.68 (*)    All other components within normal limits  LACTATE DEHYDROGENASE - Abnormal; Notable for the following components:   LDH 493 (*)    All other components within normal limits  FIBRINOGEN - Abnormal; Notable for  the following components:   Fibrinogen >800 (*)    All other components within normal limits  C-REACTIVE PROTEIN - Abnormal; Notable for the following components:   CRP 20.2 (*)    All other components within normal limits  POC SARS CORONAVIRUS 2 AG -  ED - Abnormal; Notable for the following components:   SARS Coronavirus 2 Ag POSITIVE (*)    All other components within normal limits  CULTURE, BLOOD (ROUTINE X 2)  CULTURE, BLOOD (ROUTINE X 2)  LACTIC ACID, PLASMA  FERRITIN  TRIGLYCERIDES  LACTIC ACID, PLASMA  PROCALCITONIN  I-STAT BETA HCG BLOOD, ED ([facility], [facility], [facility] ONLY)    EKG EKG Interpretation  Date/Time:  Thursday May 31 2019 20:49:35 EST Ventricular Rate:  101 PR Interval:    QRS Duration: 99 QT Interval:  378 QTC Calculation: 488 R Axis:   -16 Text Interpretation: Sinus tachycardia Borderline left axis deviation Low voltage, precordial leads Abnormal R-wave progression, early transition Minimal ST depression Borderline prolonged QT interval Baseline wander in lead(s) V1 V3 V6 Otherwise no significant change Confirmed by Deno Etienne [phone]) on 05/31/2019 9:44:29 PM   Radiology DG Chest Port 1 View  Result Date: 05/31/2019 CLINICAL DATA:  Shortness of breath and fever. EXAM: PORTABLE CHEST 1 VIEW COMPARISON:  July 29, 2006 FINDINGS: Moderate severity patchy infiltrates are seen within the right  lung base and periphery of the mid and upper right lung. There is no evidence of a pleural effusion or pneumothorax. There is mild to moderate severity enlargement of the cardiac silhouette. Multilevel degenerative changes seen throughout the thoracic spine. IMPRESSION: 1. Moderate severity right-sided infiltrate. Electronically Signed   By: Virgina Norfolk M.D.   On: 05/31/2019 21:53    Procedures Procedures (including critical care time)  Medications Ordered in ED Medications - No data to display  ED Course  I have reviewed the triage vital signs and the nursing notes.  Pertinent labs & imaging results that were available during my care of the patient were reviewed by me and considered in my medical decision making (see chart for details).    MDM Rules/Calculators/A&P                      49 yo F with a chief complaints of shortness of breath cough fever myalgias loss of taste and smell.  Patient saw her pulmonologist today and was noted to be significantly hypoxic and was sent here for evaluation.  Patient on 4 L of oxygen saturating in the upper 90s.  Continues to have some tachypnea.  Likely coronavirus based on her symptoms.  Will obtain the Covid ED order set.  Covid test is positive.  Inflammatory markers are elevated.  Will discuss with hospitalist for admission.  CRITICAL CARE Performed by: Cecilio Asper   Total critical care time: 35 minutes  Critical care time was exclusive of separately billable procedures and treating other patients.  Critical care was necessary to treat or prevent imminent or life-threatening deterioration.  Critical care was time spent personally by me on the following activities: development of treatment plan with patient and/or surrogate as well as nursing, discussions with consultants, evaluation of patient's response to treatment, examination of patient, obtaining history from patient or surrogate, ordering and performing treatments and  interventions, ordering and review of laboratory studies, ordering and review of radiographic studies, pulse oximetry and re-evaluation of patient's condition.  The patients results and plan were reviewed and discussed.   Any x-rays performed  were independently reviewed by myself.   Differential diagnosis were considered with the presenting HPI.  Medications - No data to display  Vitals:   05/31/19 2100 05/31/19 2200 05/31/19 2230 05/31/19 2300  BP: (!) 124/98 (!) 140/101 (!) 152/115 (!) 151/101  Pulse: 95 95 94 93  Resp: (!) 28 (!) 23 19 (!) 26  Temp:      TempSrc:      SpO2: 99% 99% 96% 99%    Final diagnoses:  Acute respiratory failure with hypoxia (HCC)    Admission/ observation were discussed with the admitting physician, patient and/or family and they are comfortable with the plan.    Final Clinical Impression(s) / ED Diagnoses Final diagnoses:  Acute respiratory failure with hypoxia [facility])    Rx / DC Orders ED Discharge Orders    None       Melene Plan, DO 05/31/19 2340

## 2019-05-31 NOTE — Assessment & Plan Note (Signed)
Likely Covid infection Symptom onset 05/25/2019 Pending Covid test likely PCR from [facility] on 05/30/2019  Plan: We will coordinate in person evaluation at respiratory clinic on 05/31/2019, patient would likely benefit from a chest x-ray Could consider monoclonal antibody infusion for patient once test results are received

## 2019-05-31 NOTE — Patient Instructions (Signed)
You were seen today by Coral Ceo, NP  for:   1. Suspected COVID-19 virus infection  - MYCHART COVID-19 HOME MONITORING PROGRAM; Future - Temperature monitoring; Future  Contact our office or send a MyChart message as soon as you obtain your Covid test results from the [facility]  We will work to coordinate in person evaluation at the [facility] tonight  2. Obstructive sleep apnea  We recommend that you continue using your CPAP daily >>>Keep up the hard work using your device >>> Goal should be wearing this for the entire night that you are sleeping, at least 4 to 6 hours  Remember:  . Do not drive or operate heavy machinery if tired or drowsy.  . Please notify the supply company and office if you are unable to use your device regularly due to missing supplies or machine being broken.  . Work on maintaining a healthy weight and following your recommended nutrition plan  . Maintain proper daily exercise and movement  . Maintaining proper use of your device can also help improve management of other chronic illnesses such as: Blood pressure, blood sugars, and weight management.   BiPAP/ CPAP Cleaning:  >>>Clean weekly, with Dawn soap, and bottle brush.  Set up to air dry. >>> Wipe mask out daily with wet wipe or towelette   3. Loss of smell  - MYCHART COVID-19 HOME MONITORING PROGRAM; Future    Follow Up:    Return in about 4 weeks (around 06/28/2019), or if symptoms worsen or fail to improve, for Follow up with [name] FNP-C, Follow up with Dr. [name].   Please do your part to reduce the spread of COVID-19:      Reduce your risk of any infection  and COVID19 by using the similar precautions used for avoiding the common cold or flu:  Marland Kitchen Wash your hands often with soap and warm water for at least 20 seconds.  If soap and water are not readily available, use an alcohol-based hand sanitizer with at least 60% alcohol.  . If coughing or sneezing, cover your  mouth and nose by coughing or sneezing into the elbow areas of your shirt or coat, into a tissue or into your sleeve (not your hands). Drinda Butts A MASK when in public  . Avoid shaking hands with others and consider head nods or verbal greetings only. . Avoid touching your eyes, nose, or mouth with unwashed hands.  . Avoid close contact with people who are sick. . Avoid places or events with large numbers of people in one location, like concerts or sporting events. . If you have some symptoms but not all symptoms, continue to monitor at home and seek medical attention if your symptoms worsen. . If you are having a medical emergency, call 911.   ADDITIONAL HEALTHCARE OPTIONS FOR PATIENTS  [facility] / e-Visit: [email or web address]         [facility]: [phone]  [facility]: [phone]                   [facility]: [phone]     It is flu season:   >>> Best ways to protect herself from the flu: Receive the yearly flu vaccine, practice good hand hygiene washing with soap and also using hand sanitizer when available, eat a nutritious meals, get adequate rest, hydrate appropriately   Please contact the office if your symptoms worsen or you have concerns that  you are not improving.   Thank you for choosing [facility] for your healthcare, and for allowing [place] to partner with you on your healthcare journey. I am thankful to be able to provide care to you today.   [name] FNP-C    COVID-19 COVID-19 is a respiratory infection that is caused by a virus called severe acute respiratory syndrome coronavirus 2 (SARS-CoV-2). The disease is also known as coronavirus disease or novel coronavirus. In some people, the virus may not cause any symptoms. In others, it may cause a serious infection. The infection can get worse quickly and can lead to complications, such as:  Pneumonia, or  infection of the lungs.  Acute respiratory distress syndrome or ARDS. This is a condition in which fluid build-up in the lungs prevents the lungs from filling with air and passing oxygen into the blood.  Acute respiratory failure. This is a condition in which there is not enough oxygen passing from the lungs to the body or when carbon dioxide is not passing from the lungs out of the body.  Sepsis or septic shock. This is a serious bodily reaction to an infection.  Blood clotting problems.  Secondary infections due to bacteria or fungus.  Organ failure. This is when your body's organs stop working. The virus that causes COVID-19 is contagious. This means that it can spread from person to person through droplets from coughs and sneezes (respiratory secretions). What are the causes? This illness is caused by a virus. You may catch the virus by:  Breathing in droplets from an infected person. Droplets can be spread by a person breathing, speaking, singing, coughing, or sneezing.  Touching something, like a table or a doorknob, that was exposed to the virus (contaminated) and then touching your mouth, nose, or eyes. What increases the risk? Risk for infection You are more likely to be infected with this virus if you:  Are within 6 feet (2 meters) of a person with COVID-19.  Provide care for or live with a person who is infected with COVID-19.  Spend time in crowded indoor spaces or live in shared housing. Risk for serious illness You are more likely to become seriously ill from the virus if you:  Are 54 years of age or older. The higher your age, the more you are at risk for serious illness.  Live in a nursing home or long-term care facility.  Have cancer.  Have a long-term (chronic) disease such as: ? Chronic lung disease, including chronic obstructive pulmonary disease or asthma. ? A long-term disease that lowers your body's ability to fight infection  (immunocompromised). ? Heart disease, including heart failure, a condition in which the arteries that lead to the heart become narrow or blocked (coronary artery disease), a disease which makes the heart muscle thick, weak, or stiff (cardiomyopathy). ? Diabetes. ? Chronic kidney disease. ? Sickle cell disease, a condition in which red blood cells have an abnormal "sickle" shape. ? Liver disease.  Are obese. What are the signs or symptoms? Symptoms of this condition can range from mild to severe. Symptoms may appear any time from 2 to 14 days after being exposed to the virus. They include:  A fever or chills.  A cough.  Difficulty breathing.  Headaches, body aches, or muscle aches.  Runny or stuffy (congested) nose.  A sore throat.  New loss of taste or smell. Some people may also have stomach problems, such as nausea, vomiting, or diarrhea. Other people may not  have any symptoms of COVID-19. How is this diagnosed? This condition may be diagnosed based on:  Your signs and symptoms, especially if: ? You live in an area with a COVID-19 outbreak. ? You recently traveled to or from an area where the virus is common. ? You provide care for or live with a person who was diagnosed with COVID-19. ? You were exposed to a person who was diagnosed with COVID-19.  A physical exam.  Lab tests, which may include: ? Taking a sample of fluid from the back of your nose and throat (nasopharyngeal fluid), your nose, or your throat using a swab. ? A sample of mucus from your lungs (sputum). ? Blood tests.  Imaging tests, which may include, X-rays, CT scan, or ultrasound. How is this treated? At present, there is no medicine to treat COVID-19. Medicines that treat other diseases are being used on a trial basis to see if they are effective against COVID-19. Your health care provider will talk with you about ways to treat your symptoms. For most people, the infection is mild and can be managed  at home with rest, fluids, and over-the-counter medicines. Treatment for a serious infection usually takes places in a hospital intensive care unit (ICU). It may include one or more of the following treatments. These treatments are given until your symptoms improve.  Receiving fluids and medicines through an IV.  Supplemental oxygen. Extra oxygen is given through a tube in the nose, a face mask, or a hood.  Positioning you to lie on your stomach (prone position). This makes it easier for oxygen to get into the lungs.  Continuous positive airway pressure (CPAP) or bi-level positive airway pressure (BPAP) machine. This treatment uses mild air pressure to keep the airways open. A tube that is connected to a motor delivers oxygen to the body.  Ventilator. This treatment moves air into and out of the lungs by using a tube that is placed in your windpipe.  Tracheostomy. This is a procedure to create a hole in the neck so that a breathing tube can be inserted.  Extracorporeal membrane oxygenation (ECMO). This procedure gives the lungs a chance to recover by taking over the functions of the heart and lungs. It supplies oxygen to the body and removes carbon dioxide. Follow these instructions at home: Lifestyle  If you are sick, stay home except to get medical care. Your health care provider will tell you how long to stay home. Call your health care provider before you go for medical care.  Rest at home as told by your health care provider.  Do not use any products that contain nicotine or tobacco, such as cigarettes, e-cigarettes, and chewing tobacco. If you need help quitting, ask your health care provider.  Return to your normal activities as told by your health care provider. Ask your health care provider what activities are safe for you. General instructions  Take over-the-counter and prescription medicines only as told by your health care provider.  Drink enough fluid to keep your urine  pale yellow.  Keep all follow-up visits as told by your health care provider. This is important. How is this prevented?  There is no vaccine to help prevent COVID-19 infection. However, there are steps you can take to protect yourself and others from this virus. To protect yourself:   Do not travel to areas where COVID-19 is a risk. The areas where COVID-19 is reported change often. To identify high-risk areas and travel restrictions, check the  CDC travel website: StageSync.si  If you live in, or must travel to, an area where COVID-19 is a risk, take precautions to avoid infection. ? Stay away from people who are sick. ? Wash your hands often with soap and water for 20 seconds. If soap and water are not available, use an alcohol-based hand sanitizer. ? Avoid touching your mouth, face, eyes, or nose. ? Avoid going out in public, follow guidance from your state and local health authorities. ? If you must go out in public, wear a cloth face covering or face mask. Make sure your mask covers your nose and mouth. ? Avoid crowded indoor spaces. Stay at least 6 feet (2 meters) away from others. ? Disinfect objects and surfaces that are frequently touched every day. This may include:  Counters and tables.  Doorknobs and light switches.  Sinks and faucets.  Electronics, such as phones, remote controls, keyboards, computers, and tablets. To protect others: If you have symptoms of COVID-19, take steps to prevent the virus from spreading to others.  If you think you have a COVID-19 infection, contact your health care provider right away. Tell your health care team that you think you may have a COVID-19 infection.  Stay home. Leave your house only to seek medical care. Do not use public transport.  Do not travel while you are sick.  Wash your hands often with soap and water for 20 seconds. If soap and water are not available, use alcohol-based hand sanitizer.  Stay away from  other members of your household. Let healthy household members care for children and pets, if possible. If you have to care for children or pets, wash your hands often and wear a mask. If possible, stay in your own room, separate from others. Use a different bathroom.  Make sure that all people in your household wash their hands well and often.  Cough or sneeze into a tissue or your sleeve or elbow. Do not cough or sneeze into your hand or into the air.  Wear a cloth face covering or face mask. Make sure your mask covers your nose and mouth. Where to find more information  Centers for Disease Control and Prevention: StickerEmporium.tn  World Health Organization: https://thompson-craig.com/ Contact a health care provider if:  You live in or have traveled to an area where COVID-19 is a risk and you have symptoms of the infection.  You have had contact with someone who has COVID-19 and you have symptoms of the infection. Get help right away if:  You have trouble breathing.  You have pain or pressure in your chest.  You have confusion.  You have bluish lips and fingernails.  You have difficulty waking from sleep.  You have symptoms that get worse. These symptoms may represent a serious problem that is an emergency. Do not wait to see if the symptoms will go away. Get medical help right away. Call your local emergency services (911 in the [place] Do not drive yourself to the hospital. Let the emergency medical personnel know if you think you have COVID-19. Summary  COVID-19 is a respiratory infection that is caused by a virus. It is also known as coronavirus disease or novel coronavirus. It can cause serious infections, such as pneumonia, acute respiratory distress syndrome, acute respiratory failure, or sepsis.  The virus that causes COVID-19 is contagious. This means that it can spread from person to person through droplets from breathing, speaking,  singing, coughing, or sneezing.  You are more likely to  develop a serious illness if you are 71 years of age or older, have a weak immune system, live in a nursing home, or have chronic disease.  There is no medicine to treat COVID-19. Your health care provider will talk with you about ways to treat your symptoms.  Take steps to protect yourself and others from infection. Wash your hands often and disinfect objects and surfaces that are frequently touched every day. Stay away from people who are sick and wear a mask if you are sick. This information is not intended to replace advice given to you by your health care provider. Make sure you discuss any questions you have with your health care provider. Document Revised: 02/16/2019 Document Reviewed: 05/25/2018 Elsevier Patient Education  [place].

## 2019-05-31 NOTE — Progress Notes (Signed)
Respiratory Clinic Note  Patient: [name] [name] Female    DOB: 1970-12-09   49 y.o.   MRN: [id number] Visit Date: 05/31/2019  Today's Provider: [facility]   CC: Shortness of breath  Subjective:    Covid-19 Nucleic Acid Test Results No results found for: SARSCOV2NAA, SARSCOV2  HPI the patient has been sick since 05/25/2019 presenting as cough and light wheezing.  On that day she did receive the pneumonia vaccine.  Symptoms have progressed and she is unable to sleep as she feels she is "suffocating" even trying to sleep sitting upright.  She is short of breath after walking 2 steps.  She continues to have nonproductive cough, profound fatigue, and chills.  Additionally she has myalgias particularly in  lower extremities.  She has a frontal headache with cough.She has noted anosmia and altered taste  Significant past history includes sleep apnea for which she uses CPAP.  She also has a history of asthma.  She is followed by Dr. Jetty Duhamel, Pulmonologist.  She knows of no definite Covid exposures but there have been cases at [employer] where she works.  She had a screening test yesterday 1/27 but does not know the results yet.    Current Outpatient Medications:  .  buPROPion (WELLBUTRIN) 100 MG tablet, 100 mg 3 (three) times daily. , Disp: , Rfl:  .  clindamycin (CLEOCIN T) 1 % lotion, Apply topically 2 (two) times daily., Disp: , Rfl:  .  escitalopram (LEXAPRO) 20 MG tablet, Take 20 mg by mouth daily., Disp: , Rfl:  .  fluocinolone (SYNALAR) 0.01 % external solution, , Disp: , Rfl: 0 .  meloxicam (MOBIC) 7.5 MG tablet, Take 15 mg by mouth daily. , Disp: , Rfl: 2 .  terbinafine (LAMISIL) 250 MG tablet, Take 1 tablet (250 mg total) by mouth daily., Disp: 90 tablet, Rfl: 0 .  topiramate (TOPAMAX) 100 MG tablet, , Disp: , Rfl:  .  tretinoin (RETIN-A) 0.025 % cream, Apply topically at bedtime., Disp: , Rfl:  .  VYVANSE 70 MG capsule, TK 1  C PO QAM, Disp: , Rfl: 0  Allergies   Allergen Reactions  . Amoxicillin Other (See Comments)    Review of Systems Positive review of systems for Covid infection are documented above in HPI. Not present are: Constitutional: Fever HEENT: Eye redness and discharge (conjunctivitis), nasal congestion, sore throat  Pulmonary:  production of sputum , hemoptysis, and chest pain GI: Anorexia, nausea, vomiting, diarrhea Genitourinary: Oliguria or anuria Dermatologic: Rash Neurologic: dizziness, mental status changes    Objective:   Ht 5\' 3"  (1.6 m)   BMI 52.89 kg/m   Physical Exam Positive physical findings include morbid obesity.  Boggy nares. Tachypnea is present.  O2 sats were no higher than 83% on room air.  With 2 L of oxygen O2 sat rose to 94%.  Breath sounds are markedly decreased in the lower lobes.  Clubbing is suggested.  ' sign is negative.  General appearance: Adequately nourished.   Lymphatic: No lymphadenopathy about the head, neck, axilla. Eyes: No conjunctival inflammation or lid edema is present. There is no scleral icterus. Ears:  External ear exam shows no significant lesions or deformities.   Nose:  External nasal examination shows no deformity or inflammation. Oral exam:  Lips and gums are healthy appearing. There is no oropharyngeal erythema or exudate but visualization limited. Neck:  No thyromegaly, masses, tenderness noted.    Heart:  No murmur, click, rub .  Lungs :without rhonchi, rales,  rubs. Abdomen: Bowel sounds are normal. Abdomen is soft and nontender with no organomegaly, hernias, masses. GU: Deferred  Extremities:  No cyanosis,  edema  Skin: Warm & dry w/o tenting. No significant lesions or rash.  No results found for any visits on 05/31/19.    Assessment & Plan    #1 Covid symptoms with profound hypoxemia with comorbidities of asthma, sleep apnea, and morbid obesity.  Plan: She will be transported to the emergency room by nonemergency ambulance with oxygen in place.    [place]

## 2019-05-31 NOTE — Assessment & Plan Note (Signed)
We will further assess at follow-up visits Continue CPAP therapy

## 2019-05-31 NOTE — ED Notes (Signed)
XR at bedside

## 2019-06-01 ENCOUNTER — Encounter (HOSPITAL_COMMUNITY): Payer: Self-pay | Admitting: Family Medicine

## 2019-06-01 ENCOUNTER — Inpatient Hospital Stay (HOSPITAL_COMMUNITY): Payer: BC Managed Care – PPO

## 2019-06-01 DIAGNOSIS — R791 Abnormal coagulation profile: Secondary | ICD-10-CM | POA: Diagnosis not present

## 2019-06-01 DIAGNOSIS — U071 COVID-19: Secondary | ICD-10-CM | POA: Diagnosis present

## 2019-06-01 DIAGNOSIS — Z9884 Bariatric surgery status: Secondary | ICD-10-CM | POA: Diagnosis not present

## 2019-06-01 DIAGNOSIS — J1282 Pneumonia due to coronavirus disease 2019: Secondary | ICD-10-CM

## 2019-06-01 DIAGNOSIS — J9601 Acute respiratory failure with hypoxia: Secondary | ICD-10-CM

## 2019-06-01 DIAGNOSIS — J309 Allergic rhinitis, unspecified: Secondary | ICD-10-CM | POA: Diagnosis present

## 2019-06-01 DIAGNOSIS — J45909 Unspecified asthma, uncomplicated: Secondary | ICD-10-CM | POA: Diagnosis present

## 2019-06-01 DIAGNOSIS — J9621 Acute and chronic respiratory failure with hypoxia: Secondary | ICD-10-CM | POA: Diagnosis present

## 2019-06-01 DIAGNOSIS — Z825 Family history of asthma and other chronic lower respiratory diseases: Secondary | ICD-10-CM | POA: Diagnosis not present

## 2019-06-01 DIAGNOSIS — Z6841 Body Mass Index (BMI) 40.0 and over, adult: Secondary | ICD-10-CM | POA: Diagnosis not present

## 2019-06-01 DIAGNOSIS — F3177 Bipolar disorder, in partial remission, most recent episode mixed: Secondary | ICD-10-CM | POA: Diagnosis present

## 2019-06-01 DIAGNOSIS — Z79899 Other long term (current) drug therapy: Secondary | ICD-10-CM | POA: Diagnosis not present

## 2019-06-01 DIAGNOSIS — Z881 Allergy status to other antibiotic agents status: Secondary | ICD-10-CM | POA: Diagnosis not present

## 2019-06-01 DIAGNOSIS — G4733 Obstructive sleep apnea (adult) (pediatric): Secondary | ICD-10-CM | POA: Diagnosis present

## 2019-06-01 LAB — HEMOGLOBIN A1C
Hgb A1c MFr Bld: 6.1 % — ABNORMAL HIGH (ref 4.8–5.6)
Mean Plasma Glucose: 128.37 mg/dL

## 2019-06-01 LAB — CBC
HCT: 36.9 % (ref 36.0–46.0)
Hemoglobin: 11.1 g/dL — ABNORMAL LOW (ref 12.0–15.0)
MCH: 26.2 pg (ref 26.0–34.0)
MCHC: 30.1 g/dL (ref 30.0–36.0)
MCV: 87.2 fL (ref 80.0–100.0)
Platelets: 522 10*3/uL — ABNORMAL HIGH (ref 150–400)
RBC: 4.23 MIL/uL (ref 3.87–5.11)
RDW: 17.4 % — ABNORMAL HIGH (ref 11.5–15.5)
WBC: 8.8 10*3/uL (ref 4.0–10.5)
nRBC: 0.5 % — ABNORMAL HIGH (ref 0.0–0.2)

## 2019-06-01 LAB — COMPREHENSIVE METABOLIC PANEL
ALT: 18 U/L (ref 0–44)
AST: 19 U/L (ref 15–41)
Albumin: 3 g/dL — ABNORMAL LOW (ref 3.5–5.0)
Alkaline Phosphatase: 102 U/L (ref 38–126)
Anion gap: 11 (ref 5–15)
BUN: 12 mg/dL (ref 6–20)
CO2: 23 mmol/L (ref 22–32)
Calcium: 8.9 mg/dL (ref 8.9–10.3)
Chloride: 104 mmol/L (ref 98–111)
Creatinine, Ser: 0.71 mg/dL (ref 0.44–1.00)
GFR calc Af Amer: >60 mL/min (ref >60)
GFR calc non Af Amer: >60 mL/min (ref >60)
Glucose, Bld: 229 mg/dL — ABNORMAL HIGH (ref 70–99)
Potassium: 4 mmol/L (ref 3.5–5.1)
Sodium: 138 mmol/L (ref 135–145)
Total Bilirubin: 0.2 mg/dL — ABNORMAL LOW (ref 0.3–1.2)
Total Protein: 8.2 g/dL — ABNORMAL HIGH (ref 6.5–8.1)

## 2019-06-01 LAB — C-REACTIVE PROTEIN: CRP: 23.7 mg/dL — ABNORMAL HIGH (ref <1.0)

## 2019-06-01 LAB — D-DIMER, QUANTITATIVE: D-Dimer, Quant: 2.62 ug/mL-FEU — ABNORMAL HIGH (ref 0.00–0.50)

## 2019-06-01 LAB — PHOSPHORUS: Phosphorus: 3.7 mg/dL (ref 2.5–4.6)

## 2019-06-01 LAB — ABO/RH: ABO/RH(D): B POS

## 2019-06-01 LAB — PROCALCITONIN: Procalcitonin: <0.1 ng/mL

## 2019-06-01 LAB — FERRITIN: Ferritin: 70 ng/mL (ref 11–307)

## 2019-06-01 LAB — GLUCOSE, CAPILLARY
Glucose-Capillary: 170 mg/dL — ABNORMAL HIGH (ref 70–99)
Glucose-Capillary: 154 mg/dL — ABNORMAL HIGH (ref 70–99)

## 2019-06-01 LAB — SAMPLE TO BLOOD BANK

## 2019-06-01 LAB — MAGNESIUM: Magnesium: 2 mg/dL (ref 1.7–2.4)

## 2019-06-01 LAB — HIV ANTIBODY (ROUTINE TESTING W REFLEX): HIV Screen 4th Generation wRfx: NONREACTIVE

## 2019-06-01 MED ORDER — ASCORBIC ACID 500 MG PO TABS
500.0000 mg | ORAL_TABLET | Freq: Every day | ORAL | Status: DC
  Administered 2019-06-01 – 2019-06-05 (×5): 500 mg via ORAL
  Filled 2019-06-01 (×5): qty 1

## 2019-06-01 MED ORDER — GUAIFENESIN-DM 100-10 MG/5ML PO SYRP: 10.0000 mL | ORAL_SOLUTION | ORAL | Status: DC | PRN

## 2019-06-01 MED ORDER — SODIUM CHLORIDE 0.9% FLUSH
3.0000 mL | Freq: Two times a day (BID) | INTRAVENOUS | Status: DC
  Administered 2019-06-01 – 2019-06-05 (×9): 3 mL via INTRAVENOUS

## 2019-06-01 MED ORDER — METHYLPREDNISOLONE SODIUM SUCC 125 MG IJ SOLR
50.0000 mg | Freq: Three times a day (TID) | INTRAMUSCULAR | Status: DC
  Administered 2019-06-01 – 2019-06-05 (×13): 50 mg via INTRAVENOUS
  Filled 2019-06-01 (×13): qty 2

## 2019-06-01 MED ORDER — ACETAMINOPHEN 325 MG PO TABS: 650.0000 mg | ORAL_TABLET | Freq: Four times a day (QID) | ORAL | Status: DC | PRN

## 2019-06-01 MED ORDER — INSULIN ASPART 100 UNIT/ML ~~LOC~~ SOLN
0.0000 [IU] | Freq: Three times a day (TID) | SUBCUTANEOUS | Status: DC
  Administered 2019-06-01: 3 [IU] via SUBCUTANEOUS
  Administered 2019-06-02 (×2): 2 [IU] via SUBCUTANEOUS
  Administered 2019-06-03: 3 [IU] via SUBCUTANEOUS
  Administered 2019-06-03 – 2019-06-05 (×3): 2 [IU] via SUBCUTANEOUS

## 2019-06-01 MED ORDER — HYDROCOD POLST-CPM POLST ER 10-8 MG/5ML PO SUER
5.0000 mL | Freq: Two times a day (BID) | ORAL | Status: DC | PRN
  Administered 2019-06-01: 5 mL via ORAL
  Filled 2019-06-01: qty 5

## 2019-06-01 MED ORDER — SODIUM CHLORIDE 0.9 % IV SOLN
100.0000 mg | Freq: Every day | INTRAVENOUS | Status: AC
  Administered 2019-06-02 – 2019-06-05 (×4): 100 mg via INTRAVENOUS
  Filled 2019-06-01 (×4): qty 20

## 2019-06-01 MED ORDER — SODIUM CHLORIDE 0.9% FLUSH: 3.0000 mL | INTRAVENOUS | Status: DC | PRN

## 2019-06-01 MED ORDER — ALBUTEROL SULFATE HFA 108 (90 BASE) MCG/ACT IN AERS
2.0000 | INHALATION_SPRAY | Freq: Four times a day (QID) | RESPIRATORY_TRACT | Status: DC
  Administered 2019-06-01 – 2019-06-05 (×18): 2 via RESPIRATORY_TRACT
  Filled 2019-06-01: qty 6.7

## 2019-06-01 MED ORDER — SODIUM CHLORIDE 0.9 % IV SOLN: 200.0000 mg | Freq: Once | INTRAVENOUS | Status: DC

## 2019-06-01 MED ORDER — TOPIRAMATE 100 MG PO TABS
100.0000 mg | ORAL_TABLET | Freq: Every day | ORAL | Status: DC
  Administered 2019-06-01 – 2019-06-05 (×5): 100 mg via ORAL
  Filled 2019-06-01 (×5): qty 1

## 2019-06-01 MED ORDER — ZINC SULFATE 220 (50 ZN) MG PO CAPS
220.0000 mg | ORAL_CAPSULE | Freq: Every day | ORAL | Status: DC
  Administered 2019-06-01 – 2019-06-05 (×5): 220 mg via ORAL
  Filled 2019-06-01 (×5): qty 1

## 2019-06-01 MED ORDER — SODIUM CHLORIDE 0.9 % IV SOLN: 250.0000 mL | INTRAVENOUS | Status: DC | PRN

## 2019-06-01 MED ORDER — ENOXAPARIN SODIUM 150 MG/ML ~~LOC~~ SOLN
1.0000 mg/kg | Freq: Two times a day (BID) | SUBCUTANEOUS | Status: DC
  Administered 2019-06-01: 145 mg via SUBCUTANEOUS
  Filled 2019-06-01 (×2): qty 0.97

## 2019-06-01 MED ORDER — DEXAMETHASONE SODIUM PHOSPHATE 10 MG/ML IJ SOLN: 6.0000 mg | INTRAMUSCULAR | Status: DC

## 2019-06-01 MED ORDER — SODIUM CHLORIDE 0.9 % IV SOLN: 100.0000 mg | Freq: Every day | INTRAVENOUS | Status: DC

## 2019-06-01 MED ORDER — SODIUM CHLORIDE 0.9 % IV SOLN
200.0000 mg | Freq: Once | INTRAVENOUS | Status: AC
  Administered 2019-06-01: 01:00:00 200 mg via INTRAVENOUS
  Filled 2019-06-01: qty 200

## 2019-06-01 MED ORDER — ENOXAPARIN SODIUM 80 MG/0.8ML ~~LOC~~ SOLN
70.0000 mg | SUBCUTANEOUS | Status: DC
  Administered 2019-06-01 – 2019-06-04 (×4): 70 mg via SUBCUTANEOUS
  Filled 2019-06-01 (×5): qty 0.8

## 2019-06-01 MED ORDER — SODIUM CHLORIDE 0.9% IV SOLUTION: Freq: Once | INTRAVENOUS | Status: DC

## 2019-06-01 MED ORDER — ONDANSETRON HCL 4 MG/2ML IJ SOLN: 4.0000 mg | Freq: Four times a day (QID) | INTRAMUSCULAR | Status: DC | PRN

## 2019-06-01 MED ORDER — BUPROPION HCL 100 MG PO TABS
100.0000 mg | ORAL_TABLET | Freq: Three times a day (TID) | ORAL | Status: DC
  Administered 2019-06-01 – 2019-06-05 (×11): 100 mg via ORAL
  Filled 2019-06-01 (×17): qty 1

## 2019-06-01 MED ORDER — ONDANSETRON HCL 4 MG PO TABS: 4.0000 mg | ORAL_TABLET | Freq: Four times a day (QID) | ORAL | Status: DC | PRN

## 2019-06-01 MED ORDER — INSULIN ASPART 100 UNIT/ML ~~LOC~~ SOLN: 0.0000 [IU] | Freq: Every day | SUBCUTANEOUS | Status: DC

## 2019-06-01 NOTE — ED Notes (Signed)
Pt had large liquid bowel movement in bedside commode

## 2019-06-01 NOTE — H&P (Signed)
History and Physical    [name] [name] VQQ:[id number] DOB: June 09, 1970 DOA: 05/31/2019  PCP: System, Pcp Not In  Patient coming from: home  Chief Complaint:  sob  HPI: [name] [name] is a 49 y.o. female with medical history significant of asthma, obstructive sleep apnea was following up in her pulmonologist office today for the affirmation problems acutely was having cough and worsening shortness of breath for the last week.  She reports she is been wheezing.  She does not have inhalers at home.  She has loss of smell and taste and has been very weak.  She denies any fevers.  Patient was found to have O2 sats in the low 80s on room air so she was therefore sent to the emergency department from the pulmonology office.  Patient reports she feels better with 4 to 5 L of oxygen on.  Her shortness of breath feels better also.  Patient be referred for admission for acute hypoxic respiratory failure secondary to Covid pneumonia.  Review of Systems: As per HPI otherwise 10 point review of systems negative.   Past Medical History:  Diagnosis Date  . Acute pulmonary edema (HCC) 07/01/2003  . Allergic rhinitis   . Chronic respiratory failure (HCC) 07/01/2003  . Iron deficiency anemia    d/t menometrorrhagia  . Morbid obesity (HCC)    bariatric surgery  . OSA (obstructive sleep apnea) 04/21/2003    Past Surgical History:  Procedure Laterality Date  . GASTRIC BYPASS  2009  . REDUCTION MAMMAPLASTY    . UTERINE FIBROID SURGERY       reports that she has never smoked. She has never used smokeless tobacco. She reports current alcohol use. She reports that she does not use drugs.  Allergies  Allergen Reactions  . Amoxicillin Other (See Comments)    Family History  Problem Relation Age of Onset  . Asthma Father     Prior to Admission medications   Medication Sig Start Date End Date Taking? Authorizing Provider  buPROPion (WELLBUTRIN) 100 MG tablet Take 100 mg by mouth 3 (three) times daily.   09/12/18  Yes [provider]  clindamycin (CLEOCIN T) 1 % lotion Apply topically 2 (two) times daily.   Yes [provider]  meloxicam (MOBIC) 7.5 MG tablet Take 15 mg by mouth daily.  08/27/15  Yes [provider]  naproxen sodium (ALEVE) 220 MG tablet Take 220 mg by mouth daily as needed (mild pain).   Yes [provider]  topiramate (TOPAMAX) 100 MG tablet Take 100 mg by mouth daily.  06/30/18  Yes [provider]  tretinoin (RETIN-A) 0.025 % cream Apply topically at bedtime.   Yes [provider]  VYVANSE 70 MG capsule Take 70 mg by mouth daily.  07/17/17  Yes [provider]  terbinafine (LAMISIL) 250 MG tablet Take 1 tablet (250 mg total) by mouth daily. Patient not taking: Reported on 05/31/2019 11/23/18   Lenn Sink, [place]    Physical Exam: Vitals:   05/31/19 2200 05/31/19 2230 05/31/19 2300 05/31/19 2330  BP: (!) 140/101 (!) 152/115 (!) 151/101 (!) 126/116  Pulse: 95 94 93 (!) 102  Resp: (!) 23 19 (!) 26 19  Temp:      TempSrc:      SpO2: 99% 96% 99% 99%      Constitutional: NAD, calm, comfortable Vitals:   05/31/19 2200 05/31/19 2230 05/31/19 2300 05/31/19 2330  BP: (!) 140/101 (!) 152/115 (!) 151/101 (!) 126/116  Pulse: 95  94 93 (!) 102  Resp: (!) 23 19 (!) 26 19  Temp:      TempSrc:      SpO2: 99% 96% 99% 99%   Eyes: PERRL, lids and conjunctivae normal ENMT: Mucous membranes are moist. Posterior pharynx clear of any exudate or lesions.Normal dentition.  Neck: normal, supple, no masses, no thyromegaly Respiratory: clear to auscultation bilaterally, no wheezing, no crackles. Normal respiratory effort. No accessory muscle use.  Cardiovascular: Regular rate and rhythm, no murmurs / rubs / gallops. No extremity edema. 2+ pedal pulses. No carotid bruits.  Abdomen: no tenderness, no masses palpated. No hepatosplenomegaly. Bowel sounds positive.  Musculoskeletal: no clubbing / cyanosis. No joint deformity upper  and lower extremities. Good ROM, no contractures. Normal muscle tone.  Skin: no rashes, lesions, ulcers. No induration Neurologic: CN 2-12 grossly intact. Sensation intact, DTR normal. Strength 5/5 in all 4.  Psychiatric: Normal judgment and insight. Alert and oriented x 3. Normal mood.    Labs on Admission: I have personally reviewed following labs and imaging studies  CBC: Recent Labs  Lab 05/31/19 2215  WBC 9.4  NEUTROABS 6.6  HGB 10.4*  HCT 34.7*  MCV 86.8  PLT 450*   Basic Metabolic Panel: Recent Labs  Lab 05/31/19 2215  NA 138  K 4.9  CL 105  CO2 22  GLUCOSE 102*  BUN 9  CREATININE 0.72  CALCIUM 8.5*   GFR: Estimated Creatinine Clearance: 116.9 mL/min (by C-G formula based on SCr of 0.72 mg/dL). Liver Function Tests: Recent Labs  Lab 05/31/19 2215  AST 36  ALT 15  ALKPHOS 95  BILITOT 1.2  PROT 7.6  ALBUMIN 3.1*   No results for input(s): LIPASE, AMYLASE in the last 168 hours. No results for input(s): AMMONIA in the last 168 hours. Coagulation Profile: No results for input(s): INR, PROTIME in the last 168 hours. Cardiac Enzymes: No results for input(s): CKTOTAL, CKMB, CKMBINDEX, TROPONINI in the last 168 hours. BNP (last 3 results) No results for input(s): PROBNP in the last 8760 hours. HbA1C: No results for input(s): HGBA1C in the last 72 hours. CBG: No results for input(s): GLUCAP in the last 168 hours. Lipid Profile: Recent Labs    05/31/19 2215  TRIG 66   Thyroid Function Tests: No results for input(s): TSH, T4TOTAL, FREET4, T3FREE, THYROIDAB in the last 72 hours. Anemia Panel: Recent Labs    05/31/19 2215  FERRITIN 65   Urine analysis:    Component Value Date/Time   COLORURINE YELLOW 08/09/2010 2023   APPEARANCEUR CLOUDY (A) 08/09/2010 2023   LABSPEC 1.028 08/09/2010 2023   PHURINE 5.5 08/09/2010 2023   GLUCOSEU NEGATIVE 08/09/2010 2023   HGBUR SMALL (A) 08/09/2010 2023   BILIRUBINUR small 12/05/2018 1615   KETONESUR NEGATIVE  08/09/2010 2023   PROTEINUR Negative 12/05/2018 1615   PROTEINUR NEGATIVE 08/09/2010 2023   UROBILINOGEN 0.2 12/05/2018 1615   UROBILINOGEN 0.2 08/09/2010 2023   NITRITE negative 12/05/2018 1615   NITRITE NEGATIVE 08/09/2010 2023   LEUKOCYTESUR Negative 12/05/2018 1615   Sepsis Labs: !!!!!!!!!!!!!!!!!!!!!!!!!!!!!!!!!!!!!!!!!!!! @LABRCNTIP (procalcitonin:4,lacticidven:4) )No results found for this or any previous visit (from the past 240 hour(s)).   Radiological Exams on Admission: DG Chest Port 1 View  Result Date: 05/31/2019 CLINICAL DATA:  Shortness of breath and fever. EXAM: PORTABLE CHEST 1 VIEW COMPARISON:  July 29, 2006 FINDINGS: Moderate severity patchy infiltrates are seen within the right lung base and periphery of the mid and upper right lung. There is no evidence of a pleural effusion  or pneumothorax. There is mild to moderate severity enlargement of the cardiac silhouette. Multilevel degenerative changes seen throughout the thoracic spine. IMPRESSION: 1. Moderate severity right-sided infiltrate. Electronically Signed   By: Virgina Norfolk M.D.   On: 05/31/2019 21:53   Old chart reviewed Case discussed with EDP Chest x-ray reviewed right-sided diffuse opacities   Assessment/Plan 49 year old female with history of asthma comes in with acute proximal respiratory failure secondary to Covid pneumonia Principal Problem:   Pneumonia due to COVID-19 virus-patient agreeable to Decadron and remdesivir IV with hopes of halting disease process.  Placed on vitamin C and zinc.  Placed on Lovenox also for high D-dimer.  Continue oxygen supplementation.  Daily inflammatory markers will be monitored and tracked.  Transfer to [facility].  Currently mentating normally and in no respiratory distress.  Currently on 3 L of oxygen.  Active Problems:   Acute respiratory failure with hypoxia (HCC)-currently on 3 L of oxygen secondary to Covid    Asthma-lungs clear.  Albuterol inhaler  ordered.    History of Roux-en-Y gastric bypass-noted    Bipolar disorder, in partial remission, most recent episode mixed (HCC)-stable   DVT prophylaxis: Lovenox Code Status: Full Family Communication: None Disposition Plan: Days Consults called: None Admission status: Admission   Jader Desai A MD [facility]  If 7PM-7AM, please contact night-coverage [email or web address] Password [facility]  06/01/2019, 12:38 AM

## 2019-06-01 NOTE — Progress Notes (Addendum)
PROGRESS NOTE    [name] [name]  MRN:[id number] DOB: 04/14/1971 DOA: 05/31/2019 PCP: System, Pcp Not In   Brief Narrative:  [name] [name] is a 49 y.o. female with medical history significant of asthma, obstructive sleep apnea was following up in her pulmonologist office today for the affirmation problems acutely was having cough and worsening shortness of breath for the last week.  She reports she is been wheezing.  She does not have inhalers at home.  She has loss of smell and taste and has been very weak.  She denies any fevers.  Patient was found to have O2 sats in the low 80s on room air so she was therefore sent to the emergency department from the pulmonology office.  Patient reports she feels better with 4 to 5 L of oxygen on.  Her shortness of breath feels better also.  Patient be referred for admission for acute hypoxic respiratory failure secondary to Covid pneumonia.  Assessment & Plan:   Principal Problem:   Pneumonia due to COVID-19 virus Active Problems:   Asthma   History of Roux-en-Y gastric bypass   Bipolar disorder, in partial remission, most recent episode mixed (HCC)   Acute respiratory failure with hypoxia (HCC)  Acute Hypoxic Respiratory Failure 2/2 COVID 19 Pneumonia:  Currently requiring 2 L [place] Continue steroids, remdesivir Consider actemra if worsening (discussed today, a bit overwhelmed, I don't think she needs this right now, will plan to discuss again if needed) Will discuss plasma - discussed risk/benefits, she's reviewed consent form - will order plasma 1/29.   I/O, daily weights IS, OOB, prone as able CRP significantly elevated D dimer elevated, follow LE US Transition to weight based lovenox  COVID-19 Labs  Recent Labs    05/31/19 2215 06/01/19 0854  DDIMER 2.68* 2.62*  FERRITIN 65 70  LDH 493*  --   CRP 20.2* 23.7*    No results found for: SARSCOV2NAA  Asthma: continue prn nebs  Hx Roux en y gastric bypass: noted  Bipolar Disorder:  noted  DVT prophylaxis: lovenox Code Status: full  Family Communication: none at bedside Disposition Plan:  . Patient came from: home           . Anticipated d/c place: home . Barriers to d/c OR conditions which need to be met to effect a safe d/c: improvement in oxygen requirement   Consultants:   none  Procedures:  none  Antimicrobials:  Anti-infectives (From admission, onward)   Start     Dose/Rate Route Frequency Ordered Stop   06/02/19 1000  remdesivir 100 mg in sodium chloride 0.9 % 100 mL IVPB     100 mg 200 mL/hr over 30 Minutes Intravenous Daily 06/01/19 0007 06/06/19 0959   06/01/19 1000  remdesivir 100 mg in sodium chloride 0.9 % 100 mL IVPB  Status:  Discontinued     100 mg 200 mL/hr over 30 Minutes Intravenous Daily 06/01/19 0016 06/01/19 0017   06/01/19 0030  remdesivir 200 mg in sodium chloride 0.9% 250 mL IVPB     200 mg 580 mL/hr over 30 Minutes Intravenous Once 06/01/19 0007 06/01/19 0140   06/01/19 0015  remdesivir 200 mg in sodium chloride 0.9% 250 mL IVPB  Status:  Discontinued     200 mg 580 mL/hr over 30 Minutes Intravenous Once 06/01/19 0016 06/01/19 0017     Subjective: C/o SOB Cough, cough provoked emesis  Objective: Vitals:   06/01/19 0525 06/01/19 0530 06/01/19 0612 06/01/19 0719  BP: (!) 151/106   (!)   142/101  Pulse: 90 88    Resp: (!) 21 17  (!) 21  Temp: 98.1 F (36.7 C)  99.1 F (37.3 C) 98.4 F (36.9 C)  TempSrc: Oral  Axillary Oral  SpO2: 96% 98%  94%   No intake or output data in the 24 hours ending 06/01/19 1118 There were no vitals filed for this visit.  Examination:  General exam: Appears calm and comfortable  Respiratory system: Clear to auscultation. Respiratory effort normal. Cardiovascular system: S1 & S2 heard, RRR. Gastrointestinal system: Abdomen is nondistended, soft and nontender.  Central nervous system: Alert and oriented. No focal neurological deficits. Extremities: no LEE Skin: No rashes, lesions or  ulcers Psychiatry: Judgement and insight appear normal. Mood & affect appropriate.     Data Reviewed: I have personally reviewed following labs and imaging studies  CBC: Recent Labs  Lab 05/31/19 2215 06/01/19 0854  WBC 9.4 8.8  NEUTROABS 6.6  --   HGB 10.4* 11.1*  HCT 34.7* 36.9  MCV 86.8 87.2  PLT 450* 025*   Basic Metabolic Panel: Recent Labs  Lab 05/31/19 2215 06/01/19 0854  NA 138 138  K 4.9 4.0  CL 105 104  CO2 22 23  GLUCOSE 102* 229*  BUN 9 12  CREATININE 0.72 0.71  CALCIUM 8.5* 8.9  MG  --  2.0  PHOS  --  3.7   GFR: Estimated Creatinine Clearance: 116.9 mL/min (by C-G formula based on SCr of 0.71 mg/dL). Liver Function Tests: Recent Labs  Lab 05/31/19 2215 06/01/19 0854  AST 36 19  ALT 15 18  ALKPHOS 95 102  BILITOT 1.2 0.2*  PROT 7.6 8.2*  ALBUMIN 3.1* 3.0*   No results for input(s): LIPASE, AMYLASE in the last 168 hours. No results for input(s): AMMONIA in the last 168 hours. Coagulation Profile: No results for input(s): INR, PROTIME in the last 168 hours. Cardiac Enzymes: No results for input(s): CKTOTAL, CKMB, CKMBINDEX, TROPONINI in the last 168 hours. BNP (last 3 results) No results for input(s): PROBNP in the last 8760 hours. HbA1C: No results for input(s): HGBA1C in the last 72 hours. CBG: No results for input(s): GLUCAP in the last 168 hours. Lipid Profile: Recent Labs    05/31/19 2215  TRIG 66   Thyroid Function Tests: No results for input(s): TSH, T4TOTAL, FREET4, T3FREE, THYROIDAB in the last 72 hours. Anemia Panel: Recent Labs    05/31/19 2215 06/01/19 0854  FERRITIN 65 70   Sepsis Labs: Recent Labs  Lab 05/31/19 2215  PROCALCITON <0.10  LATICACIDVEN 0.7    Recent Results (from the past 240 hour(s))  Blood Culture (routine x 2)     Status: None (Preliminary result)   Collection Time: 05/31/19 10:15 PM   Specimen: BLOOD  Result Value Ref Range Status   Specimen Description   Final    BLOOD LEFT  ANTECUBITAL Performed at [facility], [place] [address] [place], [place] [address]    Special Requests   Final    BOTTLES DRAWN AEROBIC AND ANAEROBIC Blood Culture adequate volume Performed at [facility] [address], [place], [place] [address]    Culture   Final    NO GROWTH < 12 HOURS Performed at [facility] [address], [place], [place] [address]    Report Status PENDING  Incomplete  Blood Culture (routine x 2)     Status: None (Preliminary result)   Collection Time: 05/31/19 10:15 PM   Specimen: BLOOD LEFT  HAND  Result Value Ref Range Status   Specimen Description   Final    BLOOD LEFT HAND Performed at [facility] [address] [place], [place] [address]    Special Requests   Final    BOTTLES DRAWN AEROBIC AND ANAEROBIC Blood Culture results may not be optimal due to an inadequate volume of blood received in culture bottles Performed at [facility] [address], [place], [place] [address]    Culture   Final    NO GROWTH < 12 HOURS Performed at [facility] [address] [place], [place] [address]    Report Status PENDING  Incomplete         Radiology Studies: DG Chest Port 1 View  Result Date: 05/31/2019 CLINICAL DATA:  Shortness of breath and fever. EXAM: PORTABLE CHEST 1 VIEW COMPARISON:  July 29, 2006 FINDINGS: Moderate severity patchy infiltrates are seen within the right lung base and periphery of the mid and upper right lung. There is no evidence of Laurian Edrington pleural effusion or pneumothorax. There is mild to moderate severity enlargement of the cardiac silhouette. Multilevel degenerative changes seen throughout the thoracic spine. IMPRESSION: 1. Moderate severity right-sided infiltrate. Electronically Signed   By: Virgina Norfolk M.D.   On: 05/31/2019 21:53        Scheduled Meds: . albuterol  2 puff Inhalation Q6H  . vitamin C  500 mg Oral Daily  .  enoxaparin (LOVENOX) injection  70 mg Subcutaneous Q24H  . methylPREDNISolone (SOLU-MEDROL) injection  50 mg Intravenous Q8H  . sodium chloride flush  3 mL Intravenous Q12H  . zinc sulfate  220 mg Oral Daily   Continuous Infusions: . sodium chloride    . [START ON 06/02/2019] remdesivir 100 mg in NS 100 mL       LOS: 0 days    Time spent: over 30 min    Fayrene Helper, MD [facility]   To contact the attending provider between 7A-7P or the covering provider during after hours 7P-7A, please log into the web site [email or web address] and access using universal [place] password for that web site. If you do not have the password, please call the hospital operator.  06/01/2019, 11:18 AM

## 2019-06-01 NOTE — Progress Notes (Signed)
Lower extremity venous has been completed.   Preliminary results in CV Proc.   Blanch Media 06/01/2019 11:44 AM

## 2019-06-01 NOTE — ED Notes (Signed)
Please note that pt was given 2 sandwiches and some gingerale at midnight, she ate one sandwich

## 2019-06-02 ENCOUNTER — Other Ambulatory Visit: Payer: Self-pay

## 2019-06-02 LAB — D-DIMER, QUANTITATIVE: D-Dimer, Quant: 2.23 ug/mL-FEU — ABNORMAL HIGH (ref 0.00–0.50)

## 2019-06-02 LAB — CBC WITH DIFFERENTIAL/PLATELET
Abs Immature Granulocytes: 0.31 10*3/uL — ABNORMAL HIGH (ref 0.00–0.07)
Basophils Absolute: 0.1 10*3/uL (ref 0.0–0.1)
Basophils Relative: 0 %
Eosinophils Absolute: 0 10*3/uL (ref 0.0–0.5)
Eosinophils Relative: 0 %
HCT: 35.4 % — ABNORMAL LOW (ref 36.0–46.0)
Hemoglobin: 10.3 g/dL — ABNORMAL LOW (ref 12.0–15.0)
Immature Granulocytes: 2 %
Lymphocytes Relative: 12 %
Lymphs Abs: 1.8 10*3/uL (ref 0.7–4.0)
MCH: 25.6 pg — ABNORMAL LOW (ref 26.0–34.0)
MCHC: 29.1 g/dL — ABNORMAL LOW (ref 30.0–36.0)
MCV: 88.1 fL (ref 80.0–100.0)
Monocytes Absolute: 0.5 10*3/uL (ref 0.1–1.0)
Monocytes Relative: 4 %
Neutro Abs: 12.2 10*3/uL — ABNORMAL HIGH (ref 1.7–7.7)
Neutrophils Relative %: 82 %
Platelets: 549 10*3/uL — ABNORMAL HIGH (ref 150–400)
RBC: 4.02 MIL/uL (ref 3.87–5.11)
RDW: 17.3 % — ABNORMAL HIGH (ref 11.5–15.5)
WBC: 14.9 10*3/uL — ABNORMAL HIGH (ref 4.0–10.5)
nRBC: 0.2 % (ref 0.0–0.2)

## 2019-06-02 LAB — COMPREHENSIVE METABOLIC PANEL
ALT: 21 U/L (ref 0–44)
AST: 23 U/L (ref 15–41)
Albumin: 2.9 g/dL — ABNORMAL LOW (ref 3.5–5.0)
Alkaline Phosphatase: 93 U/L (ref 38–126)
Anion gap: 7 (ref 5–15)
BUN: 18 mg/dL (ref 6–20)
CO2: 27 mmol/L (ref 22–32)
Calcium: 9.2 mg/dL (ref 8.9–10.3)
Chloride: 107 mmol/L (ref 98–111)
Creatinine, Ser: 0.68 mg/dL (ref 0.44–1.00)
GFR calc Af Amer: >60 mL/min (ref >60)
GFR calc non Af Amer: >60 mL/min (ref >60)
Glucose, Bld: 161 mg/dL — ABNORMAL HIGH (ref 70–99)
Potassium: 4 mmol/L (ref 3.5–5.1)
Sodium: 141 mmol/L (ref 135–145)
Total Bilirubin: 0.3 mg/dL (ref 0.3–1.2)
Total Protein: 7.9 g/dL (ref 6.5–8.1)

## 2019-06-02 LAB — C-REACTIVE PROTEIN: CRP: 17.3 mg/dL — ABNORMAL HIGH (ref <1.0)

## 2019-06-02 LAB — FERRITIN: Ferritin: 76 ng/mL (ref 11–307)

## 2019-06-02 LAB — GLUCOSE, CAPILLARY
Glucose-Capillary: 143 mg/dL — ABNORMAL HIGH (ref 70–99)
Glucose-Capillary: 120 mg/dL — ABNORMAL HIGH (ref 70–99)
Glucose-Capillary: 127 mg/dL — ABNORMAL HIGH (ref 70–99)
Glucose-Capillary: 177 mg/dL — ABNORMAL HIGH (ref 70–99)

## 2019-06-02 LAB — ABO/RH: ABO/RH(D): B POS

## 2019-06-02 NOTE — Progress Notes (Signed)
Alert and oriented 4x,  Up ad lib. Currently on 4 liters [place] with humidifier. Diminished breath sounds throughout, SOB with coughing spell this am. Luisa Hart Minor, family contact, placed a message on phone.

## 2019-06-02 NOTE — Plan of Care (Signed)
Breathing comfortably and using acapella valve and IS effectively. O2 weaned to 3 liters [place] with humidifier. SR, afebrile, BP slightly hypertensive.

## 2019-06-02 NOTE — Progress Notes (Addendum)
PROGRESS NOTE    [name] [name]  KGM:[id number] DOB: Jan 17, 1971 DOA: 05/31/2019 PCP: System, Pcp Not In   Brief Narrative:  [name] [name] is [name] [name] 49 y.o. female with medical history significant of asthma, obstructive sleep apnea was following up in her pulmonologist office today for the affirmation problems acutely was having cough and worsening shortness of breath for the last week.  She reports she is been wheezing.  She does not have inhalers at home.  She has loss of smell and taste and has been very weak.  She denies any fevers.  Patient was found to have O2 sats in the low 80s on room air so she was therefore sent to the emergency department from the pulmonology office.  Patient reports she feels better with 4 to 5 L of oxygen on.  Her shortness of breath feels better also.  Patient be referred for admission for acute hypoxic respiratory failure secondary to Covid pneumonia.  Assessment & Plan:   Principal Problem:   Pneumonia due to COVID-19 virus Active Problems:   Asthma   History of Roux-en-Y gastric bypass   Bipolar disorder, in partial remission, most recent episode mixed (HCC)   Acute respiratory failure with hypoxia (HCC)  Acute Hypoxic Respiratory Failure 2/2 COVID 19 Pneumonia:  Currently requiring 4 L [place] Continue steroids, remdesivir Consider actemra if worsening (she was overwhelmed when we first discussed this, if she needs this, will need to be consented, but currently stable on 4 L) S/p plasma 1/30  I/O, daily weights IS, OOB, prone as able CRP significantly elevated D dimer elevated, follow LE [place] (negative) Transition to weight based lovenox  COVID-19 Labs  Recent Labs    05/31/19 2215 06/01/19 0854 06/02/19 0229  DDIMER 2.68* 2.62* 2.23*  FERRITIN 65 70 76  LDH 493*  --   --   CRP 20.2* 23.7* 17.3*    No results found for: SARSCOV2NAA  Asthma: continue prn nebs  Hx Roux en y gastric bypass: noted  Bipolar Disorder: noted  Prediabetes  Stress  and steroid induced hyperglycemia: SSI  DVT prophylaxis: lovenox Code Status: full  Family Communication: none at bedside Disposition Plan:  . Patient came from: home           . Anticipated d/c place: home . Barriers to d/c OR conditions which need to be met to effect [place] safe d/c: improvement in oxygen requirement   Consultants:   none  Procedures:  none  Antimicrobials:  Anti-infectives (From admission, onward)   Start     Dose/Rate Route Frequency Ordered Stop   06/02/19 1000  remdesivir 100 mg in sodium chloride 0.9 % 100 mL IVPB     100 mg 200 mL/hr over 30 Minutes Intravenous Daily 06/01/19 0007 06/06/19 0959   06/01/19 1000  remdesivir 100 mg in sodium chloride 0.9 % 100 mL IVPB  Status:  Discontinued     100 mg 200 mL/hr over 30 Minutes Intravenous Daily 06/01/19 0016 06/01/19 0017   06/01/19 0030  remdesivir 200 mg in sodium chloride 0.9% 250 mL IVPB     200 mg 580 mL/hr over 30 Minutes Intravenous Once 06/01/19 0007 06/01/19 0140   06/01/19 0015  remdesivir 200 mg in sodium chloride 0.9% 250 mL IVPB  Status:  Discontinued     200 mg 580 mL/hr over 30 Minutes Intravenous Once 06/01/19 0016 06/01/19 0017     Subjective: Feeling [name] [name] bit better today  Objective: Vitals:   06/02/19 0712 06/02/19 0715 06/02/19  0800 06/02/19 0900  BP:  119/82    Pulse:  77 76   Resp:  (!) 22 (!) 22   Temp: 98.5 F (36.9 C)  97.9 F (36.6 C)   TempSrc: Oral  Oral   SpO2:  94% 93%   Weight:    (!) 143.8 kg  Height:    '5\' 3"'$  (1.6 m)    Intake/Output Summary (Last 24 hours) at 06/02/2019 1141 Last data filed at 06/02/2019 0908 Gross per 24 hour  Intake 600 ml  Output --  Net 600 ml   Filed Weights   06/02/19 0900  Weight: (!) 143.8 kg    Examination:  General: No acute distress. Cardiovascular: Heart sounds show [name] [name] regular rate, and rhythm. Lungs: Clear to auscultation bilaterally Abdomen: Soft, nontender, nondistended  Neurological: Alert and oriented 3. Moves all  extremities 4 . Cranial nerves II through XII grossly intact. Skin: Warm and dry. No rashes or lesions. Extremities: No clubbing or cyanosis. No edema.   Data Reviewed: I have personally reviewed following labs and imaging studies  CBC: Recent Labs  Lab 05/31/19 2215 06/01/19 0854 06/02/19 0229  WBC 9.4 8.8 14.9*  NEUTROABS 6.6  --  12.2*  HGB 10.4* 11.1* 10.3*  HCT 34.7* 36.9 35.4*  MCV 86.8 87.2 88.1  PLT 450* 522* 992*   Basic Metabolic Panel: Recent Labs  Lab 05/31/19 2215 06/01/19 0854 06/02/19 0229  NA 138 138 141  K 4.9 4.0 4.0  CL 105 104 107  CO2 '22 23 27  '$ GLUCOSE 102* 229* 161*  BUN '9 12 18  '$ CREATININE 0.72 0.71 0.68  CALCIUM 8.5* 8.9 9.2  MG  --  2.0  --   PHOS  --  3.7  --    GFR: Estimated Creatinine Clearance: 116.9 mL/min (by C-G formula based on SCr of 0.68 mg/dL). Liver Function Tests: Recent Labs  Lab 05/31/19 2215 06/01/19 0854 06/02/19 0229  AST 36 19 23  ALT '15 18 21  '$ ALKPHOS 95 102 93  BILITOT 1.2 0.2* 0.3  PROT 7.6 8.2* 7.9  ALBUMIN 3.1* 3.0* 2.9*   No results for input(s): LIPASE, AMYLASE in the last 168 hours. No results for input(s): AMMONIA in the last 168 hours. Coagulation Profile: No results for input(s): INR, PROTIME in the last 168 hours. Cardiac Enzymes: No results for input(s): CKTOTAL, CKMB, CKMBINDEX, TROPONINI in the last 168 hours. BNP (last 3 results) No results for input(s): PROBNP in the last 8760 hours. HbA1C: Recent Labs    06/01/19 0854  HGBA1C 6.1*   CBG: Recent Labs  Lab 06/01/19 1717 06/01/19 2044 06/02/19 0733  GLUCAP 170* 154* 143*   Lipid Profile: Recent Labs    05/31/19 2215  TRIG 66   Thyroid Function Tests: No results for input(s): TSH, T4TOTAL, FREET4, T3FREE, THYROIDAB in the last 72 hours. Anemia Panel: Recent Labs    06/01/19 0854 06/02/19 0229  FERRITIN 70 76   Sepsis Labs: Recent Labs  Lab 05/31/19 2215  PROCALCITON <0.10  LATICACIDVEN 0.7    Recent Results (from  the past 240 hour(s))  Blood Culture (routine x 2)     Status: None (Preliminary result)   Collection Time: 05/31/19 10:15 PM   Specimen: BLOOD  Result Value Ref Range Status   Specimen Description   Final    BLOOD LEFT ANTECUBITAL Performed at [facility] [address] [place], [place] [address]    Special Requests   Final    BOTTLES DRAWN AEROBIC  AND ANAEROBIC Blood Culture adequate volume Performed at [facility] [address] [place], [place] [address]    Culture   Final    NO GROWTH < 12 HOURS Performed at [facility] [address] [place], [place] [address]    Report Status PENDING  Incomplete  Blood Culture (routine x 2)     Status: None (Preliminary result)   Collection Time: 05/31/19 10:15 PM   Specimen: BLOOD LEFT HAND  Result Value Ref Range Status   Specimen Description   Final    BLOOD LEFT HAND Performed at [facility] [address], [place], [place] [address]    Special Requests   Final    BOTTLES DRAWN AEROBIC AND ANAEROBIC Blood Culture results may not be optimal due to an inadequate volume of blood received in culture bottles Performed at [facility] [address], [place], [place] [address]    Culture   Final    NO GROWTH < 12 HOURS Performed at [facility] [address], [place], [place] [address]    Report Status PENDING  Incomplete         Radiology Studies: DG Chest Port 1 View  Result Date: 05/31/2019 CLINICAL DATA:  Shortness of breath and fever. EXAM: PORTABLE CHEST 1 VIEW COMPARISON:  July 29, 2006 FINDINGS: Moderate severity patchy infiltrates are seen within the right lung base and periphery of the mid and upper right lung. There is no evidence of Ayani Ospina pleural effusion or pneumothorax. There is mild to moderate severity enlargement of the cardiac silhouette. Multilevel degenerative changes seen throughout the thoracic spine. IMPRESSION: 1.  Moderate severity right-sided infiltrate. Electronically Signed   By: Virgina Norfolk M.D.   On: 05/31/2019 21:53   VAS [place] LOWER EXTREMITY VENOUS (DVT)  Result Date: 06/02/2019  Lower Venous Study Indications: D dimer.  Limitations: Body habitus, poor ultrasound/tissue interface and pain tolerance, light interference. Comparison Study: no prior Performing Technologist: Abram Sander RVS  Examination Guidelines: [name] [name] complete evaluation includes B-mode imaging, spectral Doppler, color Doppler, and power Doppler as needed of all accessible portions of each vessel. Bilateral testing is considered an integral part of [name] [name] complete examination. Limited examinations for reoccurring indications may be performed as noted.  +---------+---------------+---------+-----------+----------+--------------+ RIGHT    CompressibilityPhasicitySpontaneityPropertiesThrombus Aging +---------+---------------+---------+-----------+----------+--------------+ CFV      Full           Yes      Yes                                 +---------+---------------+---------+-----------+----------+--------------+ SFJ      Full                                                        +---------+---------------+---------+-----------+----------+--------------+ FV Prox  Full                                                        +---------+---------------+---------+-----------+----------+--------------+ FV Mid                  Yes  Yes                                 +---------+---------------+---------+-----------+----------+--------------+ FV Distal               Yes      Yes                                 +---------+---------------+---------+-----------+----------+--------------+ PFV      Full                                                        +---------+---------------+---------+-----------+----------+--------------+ POP      Full           Yes      Yes                                  +---------+---------------+---------+-----------+----------+--------------+ PTV      Full                                                        +---------+---------------+---------+-----------+----------+--------------+ PERO                                                  Not visualized +---------+---------------+---------+-----------+----------+--------------+   +---------+---------------+---------+-----------+----------+--------------+ LEFT     CompressibilityPhasicitySpontaneityPropertiesThrombus Aging +---------+---------------+---------+-----------+----------+--------------+ CFV      Full           Yes      Yes                                 +---------+---------------+---------+-----------+----------+--------------+ SFJ      Full                                                        +---------+---------------+---------+-----------+----------+--------------+ FV Prox  Full                                                        +---------+---------------+---------+-----------+----------+--------------+ FV Mid   Full                                                        +---------+---------------+---------+-----------+----------+--------------+ FV Distal  Not visualized +---------+---------------+---------+-----------+----------+--------------+ PFV      Full                                                        +---------+---------------+---------+-----------+----------+--------------+ POP      Full           Yes      Yes                                 +---------+---------------+---------+-----------+----------+--------------+ PTV      Full                                                        +---------+---------------+---------+-----------+----------+--------------+ PERO                                                  Not visualized  +---------+---------------+---------+-----------+----------+--------------+     Summary: Right: There is no evidence of deep vein thrombosis in the lower extremity. However, portions of this examination were limited- see technologist comments above. No cystic structure found in the popliteal fossa. Left: There is no evidence of deep vein thrombosis in the lower extremity. However, portions of this examination were limited- see technologist comments above. No cystic structure found in the popliteal fossa.  *See table(s) above for measurements and observations. Electronically signed by Servando Snare MD on 06/02/2019 at 8:42:16 AM.    Final         Scheduled Meds: . sodium chloride   Intravenous Once  . albuterol  2 puff Inhalation Q6H  . vitamin C  500 mg Oral Daily  . buPROPion  100 mg Oral TID  . enoxaparin (LOVENOX) injection  70 mg Subcutaneous Q24H  . insulin aspart  0-15 Units Subcutaneous TID WC  . insulin aspart  0-5 Units Subcutaneous QHS  . methylPREDNISolone (SOLU-MEDROL) injection  50 mg Intravenous Q8H  . sodium chloride flush  3 mL Intravenous Q12H  . topiramate  100 mg Oral Daily  . zinc sulfate  220 mg Oral Daily   Continuous Infusions: . sodium chloride    . remdesivir 100 mg in NS 100 mL 100 mg (06/02/19 0838)     LOS: 1 day    Time spent: over 71 min    Fayrene Helper, MD [facility]   To contact the attending provider between 7A-7P or the covering provider during after hours 7P-7A, please log into the web site [email or web address] and access using universal [place] password for that web site. If you do not have the password, please call the hospital operator.  06/02/2019, 11:41 AM

## 2019-06-02 NOTE — Progress Notes (Signed)
The patient is alert and verbal. Continues on 4 liters of 02 via [place]. No SOB noted. No complaints of pain or discomfort.  The patient is to receive plasma, called lab and not here. [id number] The lab calls spoke with Wendie Simmer and was bringing the plasma up. 0445 the Plasma started and witnessed by A Provence. 0500 vitals done.

## 2019-06-03 ENCOUNTER — Inpatient Hospital Stay (HOSPITAL_COMMUNITY): Payer: BC Managed Care – PPO

## 2019-06-03 LAB — CBC WITH DIFFERENTIAL/PLATELET
Abs Immature Granulocytes: 0.64 10*3/uL — ABNORMAL HIGH (ref 0.00–0.07)
Basophils Absolute: 0.1 10*3/uL (ref 0.0–0.1)
Basophils Relative: 0 %
Eosinophils Absolute: 0 10*3/uL (ref 0.0–0.5)
Eosinophils Relative: 0 %
HCT: 34.7 % — ABNORMAL LOW (ref 36.0–46.0)
Hemoglobin: 10.3 g/dL — ABNORMAL LOW (ref 12.0–15.0)
Immature Granulocytes: 3 %
Lymphocytes Relative: 11 %
Lymphs Abs: 2.5 10*3/uL (ref 0.7–4.0)
MCH: 26.1 pg (ref 26.0–34.0)
MCHC: 29.7 g/dL — ABNORMAL LOW (ref 30.0–36.0)
MCV: 88.1 fL (ref 80.0–100.0)
Monocytes Absolute: 0.7 10*3/uL (ref 0.1–1.0)
Monocytes Relative: 3 %
Neutro Abs: 19.5 10*3/uL — ABNORMAL HIGH (ref 1.7–7.7)
Neutrophils Relative %: 83 %
Platelets: 591 10*3/uL — ABNORMAL HIGH (ref 150–400)
RBC: 3.94 MIL/uL (ref 3.87–5.11)
RDW: 17.5 % — ABNORMAL HIGH (ref 11.5–15.5)
WBC: 23.4 10*3/uL — ABNORMAL HIGH (ref 4.0–10.5)
nRBC: 0.2 % (ref 0.0–0.2)

## 2019-06-03 LAB — COMPREHENSIVE METABOLIC PANEL
ALT: 45 U/L — ABNORMAL HIGH (ref 0–44)
AST: 46 U/L — ABNORMAL HIGH (ref 15–41)
Albumin: 2.9 g/dL — ABNORMAL LOW (ref 3.5–5.0)
Alkaline Phosphatase: 89 U/L (ref 38–126)
Anion gap: 10 (ref 5–15)
BUN: 20 mg/dL (ref 6–20)
CO2: 27 mmol/L (ref 22–32)
Calcium: 8.6 mg/dL — ABNORMAL LOW (ref 8.9–10.3)
Chloride: 104 mmol/L (ref 98–111)
Creatinine, Ser: 0.7 mg/dL (ref 0.44–1.00)
GFR calc Af Amer: >60 mL/min (ref >60)
GFR calc non Af Amer: >60 mL/min (ref >60)
Glucose, Bld: 108 mg/dL — ABNORMAL HIGH (ref 70–99)
Potassium: 3.9 mmol/L (ref 3.5–5.1)
Sodium: 141 mmol/L (ref 135–145)
Total Bilirubin: 0.2 mg/dL — ABNORMAL LOW (ref 0.3–1.2)
Total Protein: 7.3 g/dL (ref 6.5–8.1)

## 2019-06-03 LAB — GLUCOSE, CAPILLARY
Glucose-Capillary: 124 mg/dL — ABNORMAL HIGH (ref 70–99)
Glucose-Capillary: 163 mg/dL — ABNORMAL HIGH (ref 70–99)
Glucose-Capillary: 104 mg/dL — ABNORMAL HIGH (ref 70–99)
Glucose-Capillary: 188 mg/dL — ABNORMAL HIGH (ref 70–99)

## 2019-06-03 LAB — PHOSPHORUS: Phosphorus: 4.4 mg/dL (ref 2.5–4.6)

## 2019-06-03 LAB — FERRITIN: Ferritin: 64 ng/mL (ref 11–307)

## 2019-06-03 LAB — BRAIN NATRIURETIC PEPTIDE: B Natriuretic Peptide: 32.8 pg/mL (ref 0.0–100.0)

## 2019-06-03 LAB — D-DIMER, QUANTITATIVE: D-Dimer, Quant: 1.64 ug/mL-FEU — ABNORMAL HIGH (ref 0.00–0.50)

## 2019-06-03 LAB — MAGNESIUM: Magnesium: 2.1 mg/dL (ref 1.7–2.4)

## 2019-06-03 LAB — C-REACTIVE PROTEIN: CRP: 6.4 mg/dL — ABNORMAL HIGH (ref <1.0)

## 2019-06-03 NOTE — Progress Notes (Signed)
Patient alert and verbal. No complaints of pain or discomfort. Ambulatory. No assist needed. 02 @3l  via n/c, no SOB reported and patient denies. Patient rested comfortably throughout the night.

## 2019-06-03 NOTE — Progress Notes (Signed)
Alert and oriented 4x, up ad lib well tolerated. Plan to transfer to medical floor today. Weaned to 2 liters [place] with sat >/= 93%.

## 2019-06-03 NOTE — Progress Notes (Signed)
PROGRESS NOTE    [name] [name]  DQQ:[id number] DOB: 1970-12-27 DOA: 05/31/2019 PCP: [name] [name], Pcp Not In   Brief Narrative:  [name] [name] is [name] [name] 49 y.o. female with medical history significant of asthma, obstructive sleep apnea was following up in her pulmonologist office today for the affirmation problems acutely was having cough and worsening shortness of breath for the last week.  She reports she is been wheezing.  She does not have inhalers at home.  She has loss of smell and taste and has been very weak.  She denies any fevers.  Patient was found to have O2 sats in the low 80s on room air so she was therefore sent to the emergency department from the pulmonology office.  Patient reports she feels better with 4 to 5 L of oxygen on.  Her shortness of breath feels better also.  Patient be referred for admission for acute hypoxic respiratory failure secondary to Covid pneumonia.  Assessment & Plan:   Principal Problem:   Pneumonia due to COVID-19 virus Active Problems:   Asthma   History of Roux-en-Y gastric bypass   Bipolar disorder, in partial remission, most recent episode mixed (HCC)   Acute respiratory failure with hypoxia (HCC)  Acute Hypoxic Respiratory Failure 2/2 COVID 19 Pneumonia:  Currently requiring 2 L [place] Continue steroids, remdesivir (day 3) Consider actemra if worsening (she was overwhelmed when we first discussed this, if she needs this, will need to be consented, but currently stable on 4 L) S/p plasma 1/30  I/O, daily weights IS, OOB, prone as able CRP significantly elevated D dimer elevated, follow LE [place] (negative) Transition to weight based lovenox CXR with mild improvement in bilateral patchy multifocal airpsace opacities, cardiomegaly with mild pulm interstitial congestion -> BNP wnl, appears euvolemic on exam  COVID-19 Labs  Recent Labs    05/31/19 2215 05/31/19 2215 06/01/19 0854 06/02/19 0229 06/03/19 0426  DDIMER 2.68*   < > 2.62* 2.23* 1.64*    FERRITIN 65   < > 70 76 64  LDH 493*  --   --   --   --   CRP 20.2*   < > 23.7* 17.3* 6.4*   < > = values in this interval not displayed.    No results found for: SARSCOV2NAA  Asthma: continue prn nebs  Hx Roux en y gastric bypass: noted  Bipolar Disorder: noted  Prediabetes   Stress and steroid induced hyperglycemia: SSI, a1c 6.1  Elevated Liver Enzymes: 2/2 COVID  DVT prophylaxis: lovenox Code Status: full  Family Communication: none at bedside Disposition Plan:   Patient came from: home            Anticipated d/c place: home  Barriers to d/c OR conditions which need to be met to effect [name] [name] safe d/c: improvement in oxygen requirement   Consultants:   none  Procedures:  none  Antimicrobials:  Anti-infectives (From admission, onward)   Start     Dose/Rate Route Frequency Ordered Stop   06/02/19 1000  remdesivir 100 mg in sodium chloride 0.9 % 100 mL IVPB     100 mg 200 mL/hr over 30 Minutes Intravenous Daily 06/01/19 0007 06/06/19 0959   06/01/19 1000  remdesivir 100 mg in sodium chloride 0.9 % 100 mL IVPB  Status:  Discontinued     100 mg 200 mL/hr over 30 Minutes Intravenous Daily 06/01/19 0016 06/01/19 0017   06/01/19 0030  remdesivir 200 mg in sodium chloride 0.9% 250 mL IVPB  200 mg 580 mL/hr over 30 Minutes Intravenous Once 06/01/19 0007 06/01/19 0140   06/01/19 0015  remdesivir 200 mg in sodium chloride 0.9% 250 mL IVPB  Status:  Discontinued     200 mg 580 mL/hr over 30 Minutes Intravenous Once 06/01/19 0016 06/01/19 0017     Subjective: Feeling [name] [name] bit better today  Objective: Vitals:   06/02/19 2053 06/03/19 0053 06/03/19 0534 06/03/19 0728  BP: 133/86 131/88 113/72 (!) 139/94  Pulse: 85 76 82 77  Resp: (!) 24 18 (!) 22 19  Temp: (!) 97.5 F (36.4 C) 98.4 F (36.9 C) 97.7 F (36.5 C) 98.5 F (36.9 C)  TempSrc: Oral Oral Oral Oral  SpO2: 95% 96% 97% 94%  Weight:      Height:        Intake/Output Summary (Last 24 hours) at 06/03/2019  1129 Last data filed at 06/03/2019 0900 Gross per 24 hour  Intake 720 ml  Output --  Net 720 ml   Filed Weights   06/02/19 0900  Weight: (!) 143.8 kg    Examination:  General: No acute distress. Cardiovascular: Heart sounds show [name] [name] regular rate, and rhythm. Lungs: Clear to auscultation bilaterally Abdomen: Soft, nontender, nondistended  Neurological: Alert and oriented 3. Moves all extremities 4 . Cranial nerves II through XII grossly intact. Skin: Warm and dry. No rashes or lesions. Extremities: No clubbing or cyanosis. No edema.   Data Reviewed: I have personally reviewed following labs and imaging studies  CBC: Recent Labs  Lab 05/31/19 2215 06/01/19 0854 06/02/19 0229 06/03/19 0426  WBC 9.4 8.8 14.9* 23.4*  NEUTROABS 6.6  --  12.2* 19.5*  HGB 10.4* 11.1* 10.3* 10.3*  HCT 34.7* 36.9 35.4* 34.7*  MCV 86.8 87.2 88.1 88.1  PLT 450* 522* 549* 297*   Basic Metabolic Panel: Recent Labs  Lab 05/31/19 2215 06/01/19 0854 06/02/19 0229 06/03/19 0426  NA 138 138 141 141  K 4.9 4.0 4.0 3.9  CL 105 104 107 104  CO2 '22 23 27 27  '$ GLUCOSE 102* 229* 161* 108*  BUN '9 12 18 20  '$ CREATININE 0.72 0.71 0.68 0.70  CALCIUM 8.5* 8.9 9.2 8.6*  MG  --  2.0  --  2.1  PHOS  --  3.7  --  4.4   GFR: Estimated Creatinine Clearance: 116.9 mL/min (by C-G formula based on SCr of 0.7 mg/dL). Liver Function Tests: Recent Labs  Lab 05/31/19 2215 06/01/19 0854 06/02/19 0229 06/03/19 0426  AST 36 19 23 46*  ALT '15 18 21 '$ 45*  ALKPHOS 95 102 93 89  BILITOT 1.2 0.2* 0.3 0.2*  PROT 7.6 8.2* 7.9 7.3  ALBUMIN 3.1* 3.0* 2.9* 2.9*   No results for input(s): LIPASE, AMYLASE in the last 168 hours. No results for input(s): AMMONIA in the last 168 hours. Coagulation Profile: No results for input(s): INR, PROTIME in the last 168 hours. Cardiac Enzymes: No results for input(s): CKTOTAL, CKMB, CKMBINDEX, TROPONINI in the last 168 hours. BNP (last 3 results) No results for input(s): PROBNP  in the last 8760 hours. HbA1C: Recent Labs    06/01/19 0854  HGBA1C 6.1*   CBG: Recent Labs  Lab 06/02/19 1204 06/02/19 1648 06/02/19 2052 06/03/19 0739 06/03/19 1106  GLUCAP 120* 127* 177* 124* 163*   Lipid Profile: Recent Labs    05/31/19 2215  TRIG 66   Thyroid Function Tests: No results for input(s): TSH, T4TOTAL, FREET4, T3FREE, THYROIDAB in the last 72 hours. Anemia Panel: Recent Labs  06/02/19 0229 06/03/19 0426  FERRITIN 76 64   Sepsis Labs: Recent Labs  Lab 05/31/19 2215  PROCALCITON <0.10  LATICACIDVEN 0.7    Recent Results (from the past 240 hour(s))  Blood Culture (routine x 2)     Status: None (Preliminary result)   Collection Time: 05/31/19 10:15 PM   Specimen: BLOOD  Result Value Ref Range Status   Specimen Description   Final    BLOOD LEFT ANTECUBITAL Performed at [facility] [address], [place], [place] [address]    Special Requests   Final    BOTTLES DRAWN AEROBIC AND ANAEROBIC Blood Culture adequate volume Performed at [facility] [address] [place], [place] [address]    Culture   Final    NO GROWTH 2 DAYS Performed at [facility] [address], [place], [place] [address]    Report Status PENDING  Incomplete  Blood Culture (routine x 2)     Status: None (Preliminary result)   Collection Time: 05/31/19 10:15 PM   Specimen: BLOOD LEFT HAND  Result Value Ref Range Status   Specimen Description   Final    BLOOD LEFT HAND Performed at [facility] [address] [place], [place] [address]    Special Requests   Final    BOTTLES DRAWN AEROBIC AND ANAEROBIC Blood Culture results may not be optimal due to an inadequate volume of blood received in culture bottles Performed at [facility] [address] [place], [place] [address]    Culture   Final    NO GROWTH 2 DAYS Performed at [facility] [address], [place],   [address]    Report Status PENDING  Incomplete         Radiology Studies: DG CHEST PORT 1 VIEW  Result Date: 06/03/2019 CLINICAL DATA:  Initial evaluation for hypoxia, COVID positive. EXAM: PORTABLE CHEST 1 VIEW COMPARISON:  Prior radiograph from 05/31/2019. FINDINGS: Stable cardiomegaly. Mediastinal silhouette within normal limits. Lungs normally inflated. Previously seen patchy multifocal airspace opacities appear mildly improved from previous. Underlying mild pulmonary interstitial congestion without overt pulmonary edema. No pleural effusion. No pneumothorax. No acute osseous finding. IMPRESSION: 1. Mild interval improvement in bilateral patchy multifocal airspace opacities, suggesting improving pneumonia. 2. Stable cardiomegaly with underlying mild pulmonary interstitial congestion. Electronically Signed   By: Jeannine Boga M.D.   On: 06/03/2019 07:33        Scheduled Meds:  sodium chloride   Intravenous Once   albuterol  2 puff Inhalation Q6H   vitamin C  500 mg Oral Daily   buPROPion  100 mg Oral TID   enoxaparin (LOVENOX) injection  70 mg Subcutaneous Q24H   insulin aspart  0-15 Units Subcutaneous TID WC   insulin aspart  0-5 Units Subcutaneous QHS   methylPREDNISolone (SOLU-MEDROL) injection  50 mg Intravenous Q8H   sodium chloride flush  3 mL Intravenous Q12H   topiramate  100 mg Oral Daily   zinc sulfate  220 mg Oral Daily   Continuous Infusions:  sodium chloride     remdesivir 100 mg in NS 100 mL 100 mg (06/03/19 0844)     LOS: 2 days    Time spent: over 30 min    Fayrene Helper, MD [facility]   To contact the attending provider between 7A-7P or the covering provider during after hours 7P-7A, please log into the web site [email or web address] and access using universal [place] password  for that web site. If you do not have the password, please call the hospital operator.  06/03/2019, 11:29 AM

## 2019-06-04 LAB — COMPREHENSIVE METABOLIC PANEL
ALT: 45 U/L — ABNORMAL HIGH (ref 0–44)
AST: 29 U/L (ref 15–41)
Albumin: 3 g/dL — ABNORMAL LOW (ref 3.5–5.0)
Alkaline Phosphatase: 87 U/L (ref 38–126)
Anion gap: 12 (ref 5–15)
BUN: 16 mg/dL (ref 6–20)
CO2: 26 mmol/L (ref 22–32)
Calcium: 8.9 mg/dL (ref 8.9–10.3)
Chloride: 103 mmol/L (ref 98–111)
Creatinine, Ser: 0.78 mg/dL (ref 0.44–1.00)
GFR calc Af Amer: >60 mL/min (ref >60)
GFR calc non Af Amer: >60 mL/min (ref >60)
Glucose, Bld: 121 mg/dL — ABNORMAL HIGH (ref 70–99)
Potassium: 3.9 mmol/L (ref 3.5–5.1)
Sodium: 141 mmol/L (ref 135–145)
Total Bilirubin: 0.2 mg/dL — ABNORMAL LOW (ref 0.3–1.2)
Total Protein: 7.5 g/dL (ref 6.5–8.1)

## 2019-06-04 LAB — CBC WITH DIFFERENTIAL/PLATELET
Abs Immature Granulocytes: 0.9 10*3/uL — ABNORMAL HIGH (ref 0.00–0.07)
Basophils Absolute: 0.2 10*3/uL — ABNORMAL HIGH (ref 0.0–0.1)
Basophils Relative: 1 %
Eosinophils Absolute: 0 10*3/uL (ref 0.0–0.5)
Eosinophils Relative: 0 %
HCT: 39.1 % (ref 36.0–46.0)
Hemoglobin: 11.5 g/dL — ABNORMAL LOW (ref 12.0–15.0)
Immature Granulocytes: 5 %
Lymphocytes Relative: 12 %
Lymphs Abs: 2.5 10*3/uL (ref 0.7–4.0)
MCH: 26 pg (ref 26.0–34.0)
MCHC: 29.4 g/dL — ABNORMAL LOW (ref 30.0–36.0)
MCV: 88.5 fL (ref 80.0–100.0)
Monocytes Absolute: 0.6 10*3/uL (ref 0.1–1.0)
Monocytes Relative: 3 %
Neutro Abs: 15.8 10*3/uL — ABNORMAL HIGH (ref 1.7–7.7)
Neutrophils Relative %: 79 %
Platelets: 661 10*3/uL — ABNORMAL HIGH (ref 150–400)
RBC: 4.42 MIL/uL (ref 3.87–5.11)
RDW: 17.7 % — ABNORMAL HIGH (ref 11.5–15.5)
WBC: 19.9 10*3/uL — ABNORMAL HIGH (ref 4.0–10.5)
nRBC: 0.3 % — ABNORMAL HIGH (ref 0.0–0.2)

## 2019-06-04 LAB — PREPARE FRESH FROZEN PLASMA

## 2019-06-04 LAB — BPAM FFP
Blood Product Expiration Date: 202101310005
ISSUE DATE / TIME: 202101300150
Unit Type and Rh: 1700

## 2019-06-04 LAB — MAGNESIUM: Magnesium: 2.1 mg/dL (ref 1.7–2.4)

## 2019-06-04 LAB — GLUCOSE, CAPILLARY
Glucose-Capillary: 115 mg/dL — ABNORMAL HIGH (ref 70–99)
Glucose-Capillary: 141 mg/dL — ABNORMAL HIGH (ref 70–99)
Glucose-Capillary: 100 mg/dL — ABNORMAL HIGH (ref 70–99)
Glucose-Capillary: 129 mg/dL — ABNORMAL HIGH (ref 70–99)

## 2019-06-04 LAB — PHOSPHORUS: Phosphorus: 4 mg/dL (ref 2.5–4.6)

## 2019-06-04 LAB — D-DIMER, QUANTITATIVE: D-Dimer, Quant: 1.53 ug/mL-FEU — ABNORMAL HIGH (ref 0.00–0.50)

## 2019-06-04 LAB — FERRITIN: Ferritin: 51 ng/mL (ref 11–307)

## 2019-06-04 LAB — C-REACTIVE PROTEIN: CRP: 3.1 mg/dL — ABNORMAL HIGH (ref <1.0)

## 2019-06-04 NOTE — Progress Notes (Signed)
PROGRESS NOTE    [name]  NKN:[id number] DOB: 1970-07-13 DOA: 05/31/2019 PCP: System, Pcp Not In   Brief Narrative:  [name] [name] is [name] [name] 49 y.o. female with medical history significant of asthma, obstructive sleep apnea was following up in her pulmonologist office today for the affirmation problems acutely was having cough and worsening shortness of breath for the last week.  She reports she is been wheezing.  She does not have inhalers at home.  She has loss of smell and taste and has been very weak.  She denies any fevers.  Patient was found to have O2 sats in the low 80s on room air so she was therefore sent to the emergency department from the pulmonology office.  Patient reports she feels better with 4 to 5 L of oxygen on.  Her shortness of breath feels better also.  Patient be referred for admission for acute hypoxic respiratory failure secondary to Covid pneumonia.  Assessment & Plan:   Principal Problem:   Pneumonia due to COVID-19 virus Active Problems:   Asthma   History of Roux-en-Y gastric bypass   Bipolar disorder, in partial remission, most recent episode mixed (HCC)   Acute respiratory failure with hypoxia (HCC)  Acute Hypoxic Respiratory Failure 2/2 COVID 19 Pneumonia:  Currently doing well on RA Continue steroids, remdesivir (day 4) - pt prefers to stay for final dose of remdesivir tomorrow Consider actemra if worsening (she was overwhelmed when we first discussed this, if she needs this, will need to be consented, but currently stable on 4 L) S/p plasma 1/30  I/O, daily weights IS, OOB, prone as able CRP significantly elevated D dimer elevated, follow LE [place] (negative) Transition to weight based lovenox CXR with mild improvement in bilateral patchy multifocal airpsace opacities, cardiomegaly with mild pulm interstitial congestion -> BNP wnl, appears euvolemic on exam  COVID-19 Labs  Recent Labs    06/02/19 0229 06/03/19 0426 06/04/19 0353  DDIMER 2.23*  1.64* 1.53*  FERRITIN 76 64 51  CRP 17.3* 6.4* 3.1*    No results found for: SARSCOV2NAA  Asthma: continue prn nebs  Hx Roux en y gastric bypass: noted  Bipolar Disorder: noted  Prediabetes  Stress and steroid induced hyperglycemia: SSI, a1c 6.1  Elevated Liver Enzymes: 2/2 COVID  Leukocytosis: likely 2/2 steroids, follow  Thrombocytosis: likely related to acute infection, ctm  DVT prophylaxis: lovenox Code Status: full  Family Communication: none at bedside Disposition Plan:  . Patient came from: home           . Anticipated d/c place: home . Barriers to d/c OR conditions which need to be met to effect Farhiya Rosten safe d/c: improvement in oxygen requirement   Consultants:   none  Procedures:  none  Antimicrobials:  Anti-infectives (From admission, onward)   Start     Dose/Rate Route Frequency Ordered Stop   06/02/19 1000  remdesivir 100 mg in sodium chloride 0.9 % 100 mL IVPB     100 mg 200 mL/hr over 30 Minutes Intravenous Daily 06/01/19 0007 06/06/19 0959   06/01/19 1000  remdesivir 100 mg in sodium chloride 0.9 % 100 mL IVPB  Status:  Discontinued     100 mg 200 mL/hr over 30 Minutes Intravenous Daily 06/01/19 0016 06/01/19 0017   06/01/19 0030  remdesivir 200 mg in sodium chloride 0.9% 250 mL IVPB     200 mg 580 mL/hr over 30 Minutes Intravenous Once 06/01/19 0007 06/01/19 0140   06/01/19 0015  remdesivir 200  mg in sodium chloride 0.9% 250 mL IVPB  Status:  Discontinued     200 mg 580 mL/hr over 30 Minutes Intravenous Once 06/01/19 0016 06/01/19 0017     Subjective: Feels progressively better  Objective: Vitals:   06/03/19 2032 06/04/19 0500 06/04/19 0749 06/04/19 0752  BP: (!) 157/108 (!) 145/99 (!) 145/97 135/90  Pulse: 78  79 75  Resp: (!) '21  20 20  '$ Temp: 98.5 F (36.9 C) 98.3 F (36.8 C) 98.7 F (37.1 C)   TempSrc: Oral Oral Oral   SpO2: 94%  97% 93%  Weight:      Height:        Intake/Output Summary (Last 24 hours) at 06/04/2019 1032 Last data  filed at 06/04/2019 0844 Gross per 24 hour  Intake 240 ml  Output 200 ml  Net 40 ml   Filed Weights   06/02/19 0900  Weight: (!) 143.8 kg    Examination:  General: No acute distress. Cardiovascular: Heart sounds show Shalon Salado regular rate, and rhythm.  Lungs: Clear to auscultation bilaterally  Abdomen: Soft, nontender, nondistended  Neurological: Alert and oriented 3. Moves all extremities 4. Cranial nerves II through XII grossly intact. Skin: Warm and dry. No rashes or lesions. Extremities: No clubbing or cyanosis. No edema.  Data Reviewed: I have personally reviewed following labs and imaging studies  CBC: Recent Labs  Lab 05/31/19 2215 06/01/19 0854 06/02/19 0229 06/03/19 0426 06/04/19 0353  WBC 9.4 8.8 14.9* 23.4* 19.9*  NEUTROABS 6.6  --  12.2* 19.5* 15.8*  HGB 10.4* 11.1* 10.3* 10.3* 11.5*  HCT 34.7* 36.9 35.4* 34.7* 39.1  MCV 86.8 87.2 88.1 88.1 88.5  PLT 450* 522* 549* 591* 314*   Basic Metabolic Panel: Recent Labs  Lab 05/31/19 2215 06/01/19 0854 06/02/19 0229 06/03/19 0426 06/04/19 0353  NA 138 138 141 141 141  K 4.9 4.0 4.0 3.9 3.9  CL 105 104 107 104 103  CO2 '22 23 27 27 26  '$ GLUCOSE 102* 229* 161* 108* 121*  BUN '9 12 18 20 16  '$ CREATININE 0.72 0.71 0.68 0.70 0.78  CALCIUM 8.5* 8.9 9.2 8.6* 8.9  MG  --  2.0  --  2.1 2.1  PHOS  --  3.7  --  4.4 4.0   GFR: Estimated Creatinine Clearance: 116.9 mL/min (by C-G formula based on SCr of 0.78 mg/dL). Liver Function Tests: Recent Labs  Lab 05/31/19 2215 06/01/19 0854 06/02/19 0229 06/03/19 0426 06/04/19 0353  AST 36 19 23 46* 29  ALT '15 18 21 '$ 45* 45*  ALKPHOS 95 102 93 89 87  BILITOT 1.2 0.2* 0.3 0.2* 0.2*  PROT 7.6 8.2* 7.9 7.3 7.5  ALBUMIN 3.1* 3.0* 2.9* 2.9* 3.0*   No results for input(s): LIPASE, AMYLASE in the last 168 hours. No results for input(s): AMMONIA in the last 168 hours. Coagulation Profile: No results for input(s): INR, PROTIME in the last 168 hours. Cardiac Enzymes: No results  for input(s): CKTOTAL, CKMB, CKMBINDEX, TROPONINI in the last 168 hours. BNP (last 3 results) No results for input(s): PROBNP in the last 8760 hours. HbA1C: No results for input(s): HGBA1C in the last 72 hours. CBG: Recent Labs  Lab 06/03/19 0739 06/03/19 1106 06/03/19 1554 06/03/19 2025 06/04/19 0740  GLUCAP 124* 163* 104* 188* 115*   Lipid Profile: No results for input(s): CHOL, HDL, LDLCALC, TRIG, CHOLHDL, LDLDIRECT in the last 72 hours. Thyroid Function Tests: No results for input(s): TSH, T4TOTAL, FREET4, T3FREE, THYROIDAB in the last 72 hours. Anemia  Panel: Recent Labs    06/03/19 0426 06/04/19 0353  FERRITIN 64 51   Sepsis Labs: Recent Labs  Lab 05/31/19 2215  PROCALCITON <0.10  LATICACIDVEN 0.7    Recent Results (from the past 240 hour(s))  Blood Culture (routine x 2)     Status: None (Preliminary result)   Collection Time: 05/31/19 10:15 PM   Specimen: BLOOD  Result Value Ref Range Status   Specimen Description   Final    BLOOD LEFT ANTECUBITAL Performed at [facility], [place] [address] [place], [place] [address]    Special Requests   Final    BOTTLES DRAWN AEROBIC AND ANAEROBIC Blood Culture adequate volume Performed at [facility] [address] [place], [place] [address]    Culture   Final    NO GROWTH 3 DAYS Performed at [facility], [place] [address], [place], [place] [address]    Report Status PENDING  Incomplete  Blood Culture (routine x 2)     Status: None (Preliminary result)   Collection Time: 05/31/19 10:15 PM   Specimen: BLOOD LEFT HAND  Result Value Ref Range Status   Specimen Description   Final    BLOOD LEFT HAND Performed at [facility] [address] [place], [place] [address]    Special Requests   Final    BOTTLES DRAWN AEROBIC AND ANAEROBIC Blood Culture results may not be optimal due to an inadequate volume of blood received in culture bottles Performed at  [facility] [address] [place], [place] [address]    Culture   Final    NO GROWTH 3 DAYS Performed at [facility], [place] [address] [place], [place] [address]    Report Status PENDING  Incomplete         Radiology Studies: DG CHEST PORT 1 VIEW  Result Date: 06/03/2019 CLINICAL DATA:  Initial evaluation for hypoxia, COVID positive. EXAM: PORTABLE CHEST 1 VIEW COMPARISON:  Prior radiograph from 05/31/2019. FINDINGS: Stable cardiomegaly. Mediastinal silhouette within normal limits. Lungs normally inflated. Previously seen patchy multifocal airspace opacities appear mildly improved from previous. Underlying mild pulmonary interstitial congestion without overt pulmonary edema. No pleural effusion. No pneumothorax. No acute osseous finding. IMPRESSION: 1. Mild interval improvement in bilateral patchy multifocal airspace opacities, suggesting improving pneumonia. 2. Stable cardiomegaly with underlying mild pulmonary interstitial congestion. Electronically Signed   By: Jeannine Boga M.D.   On: 06/03/2019 07:33        Scheduled Meds: . sodium chloride   Intravenous Once  . albuterol  2 puff Inhalation Q6H  . vitamin C  500 mg Oral Daily  . buPROPion  100 mg Oral TID  . enoxaparin (LOVENOX) injection  70 mg Subcutaneous Q24H  . insulin aspart  0-15 Units Subcutaneous TID WC  . insulin aspart  0-5 Units Subcutaneous QHS  . methylPREDNISolone (SOLU-MEDROL) injection  50 mg Intravenous Q8H  . sodium chloride flush  3 mL Intravenous Q12H  . topiramate  100 mg Oral Daily  . zinc sulfate  220 mg Oral Daily   Continuous Infusions: . sodium chloride    . remdesivir 100 mg in NS 100 mL 100 mg (06/04/19 0913)     LOS: 3 days    Time spent: over 58 min    Fayrene Helper, MD [facility]   To contact the attending provider between 7A-7P or the covering provider during after hours 7P-7A, please log into the web site [email or web address] and  access  using universal [place] password for that web site. If you do not have the password, please call the hospital operator.  06/04/2019, 10:32 AM

## 2019-06-04 NOTE — Progress Notes (Signed)
SATURATION QUALIFICATIONS: (This note is used to comply with regulatory documentation for home oxygen)  Patient Saturations on Room Air at Rest =100 %  Patient Saturations on Room Air while Ambulating =86%  Patient Saturations on 2 Liters of oxygen while Ambulating = 93%  Please briefly explain why patient needs home oxygen: Patient desaturates while ambulating short walks in hallway.

## 2019-06-04 NOTE — Progress Notes (Signed)
Patient alert and verbal. Able to communicate all needs and wants. The patient ambulates indep. No complaints of pain or discomfort. The patient rested well throughout the night.

## 2019-06-05 LAB — COMPREHENSIVE METABOLIC PANEL WITH GFR
ALT: 42 U/L (ref 0–44)
AST: 24 U/L (ref 15–41)
Albumin: 2.9 g/dL — ABNORMAL LOW (ref 3.5–5.0)
Alkaline Phosphatase: 82 U/L (ref 38–126)
Anion gap: 9 (ref 5–15)
BUN: 16 mg/dL (ref 6–20)
CO2: 27 mmol/L (ref 22–32)
Calcium: 8.8 mg/dL — ABNORMAL LOW (ref 8.9–10.3)
Chloride: 104 mmol/L (ref 98–111)
Creatinine, Ser: 0.79 mg/dL (ref 0.44–1.00)
GFR calc Af Amer: >60 mL/min
GFR calc non Af Amer: >60 mL/min
Glucose, Bld: 141 mg/dL — ABNORMAL HIGH (ref 70–99)
Potassium: 3.6 mmol/L (ref 3.5–5.1)
Sodium: 140 mmol/L (ref 135–145)
Total Bilirubin: 0.3 mg/dL (ref 0.3–1.2)
Total Protein: 7 g/dL (ref 6.5–8.1)

## 2019-06-05 LAB — CBC WITH DIFFERENTIAL/PLATELET
Abs Immature Granulocytes: 1.15 10*3/uL — ABNORMAL HIGH (ref 0.00–0.07)
Basophils Absolute: 0.2 10*3/uL — ABNORMAL HIGH (ref 0.0–0.1)
Basophils Relative: 1 %
Eosinophils Absolute: 0 10*3/uL (ref 0.0–0.5)
Eosinophils Relative: 0 %
HCT: 37.4 % (ref 36.0–46.0)
Hemoglobin: 11.3 g/dL — ABNORMAL LOW (ref 12.0–15.0)
Immature Granulocytes: 5 %
Lymphocytes Relative: 13 %
Lymphs Abs: 2.8 10*3/uL (ref 0.7–4.0)
MCH: 26.2 pg (ref 26.0–34.0)
MCHC: 30.2 g/dL (ref 30.0–36.0)
MCV: 86.6 fL (ref 80.0–100.0)
Monocytes Absolute: 1 10*3/uL (ref 0.1–1.0)
Monocytes Relative: 5 %
Neutro Abs: 16.1 10*3/uL — ABNORMAL HIGH (ref 1.7–7.7)
Neutrophils Relative %: 76 %
Platelets: 629 10*3/uL — ABNORMAL HIGH (ref 150–400)
RBC: 4.32 MIL/uL (ref 3.87–5.11)
RDW: 17.6 % — ABNORMAL HIGH (ref 11.5–15.5)
WBC: 21.2 10*3/uL — ABNORMAL HIGH (ref 4.0–10.5)
nRBC: 0.2 % (ref 0.0–0.2)

## 2019-06-05 LAB — GLUCOSE, CAPILLARY: Glucose-Capillary: 149 mg/dL — ABNORMAL HIGH (ref 70–99)

## 2019-06-05 LAB — MAGNESIUM: Magnesium: 2.1 mg/dL (ref 1.7–2.4)

## 2019-06-05 LAB — CULTURE, BLOOD (ROUTINE X 2)
Culture: NO GROWTH
Culture: NO GROWTH
Special Requests: ADEQUATE

## 2019-06-05 LAB — D-DIMER, QUANTITATIVE: D-Dimer, Quant: 1.47 ug{FEU}/mL — ABNORMAL HIGH (ref 0.00–0.50)

## 2019-06-05 LAB — FERRITIN: Ferritin: 38 ng/mL (ref 11–307)

## 2019-06-05 LAB — PHOSPHORUS: Phosphorus: 3.6 mg/dL (ref 2.5–4.6)

## 2019-06-05 LAB — C-REACTIVE PROTEIN: CRP: 1.3 mg/dL — ABNORMAL HIGH

## 2019-06-05 NOTE — Progress Notes (Addendum)
SATURATION QUALIFICATIONS: (This note is used to comply with regulatory documentation for home oxygen)  Patient Saturations on Room Air at Rest = 95%  Patient Saturations on Room Air while Ambulating = 90% occassional drops to 85%.  Patient Saturations on 2 Liters of oxygen while Ambulating = 92%  Please briefly explain why patient needs home oxygen: 2L to keep pt at 88% while ambulating.

## 2019-06-05 NOTE — Plan of Care (Signed)
  Problem: Education: Goal: Knowledge of risk factors and measures for prevention of condition will improve Outcome: Progressing   Problem: Coping: Goal: Psychosocial and spiritual needs will be supported Outcome: Progressing   Problem: Respiratory: Goal: Will maintain a patent airway Outcome: Progressing Goal: Complications related to the disease process, condition or treatment will be avoided or minimized Outcome: Progressing   

## 2019-06-05 NOTE — Discharge Instructions (Signed)
You need to continue to quarantine for 21 days since your positive test.  You can discontinue your quarantine after February 18th if you continue to improve without the use of medications.  Call the health department or your PCP with questions.     Person Under Monitoring Name: [name] [name]  Location: [address] [place] [address]   Infection Prevention Recommendations for Individuals Confirmed to have, or Being Evaluated for, 2019 Novel Coronavirus (COVID-19) Infection Who Receive Care at Home  Individuals who are confirmed to have, or are being evaluated for, COVID-19 should follow the prevention steps below until [name] [name] healthcare provider or local or state health department says they can return to normal activities.  Stay home except to get medical care You should restrict activities outside your home, except for getting medical care. Do not go to work, school, or public areas, and do not use public transportation or taxis.  Call ahead before visiting your doctor Before your medical appointment, call the healthcare provider and tell them that you have, or are being evaluated for, COVID-19 infection. This will help the healthcare provider's office take steps to keep other people from getting infected. Ask your healthcare provider to call the local or state health department.  Monitor your symptoms Seek prompt medical attention if your illness is worsening (e.g., difficulty breathing). Before going to your medical appointment, call the healthcare provider and tell them that you have, or are being evaluated for, COVID-19 infection. Ask your healthcare provider to call the local or state health department.  Wear [name] [name] facemask You should wear [name] [name] facemask that covers your nose and mouth when you are in the same room with other people and when you visit [name] [name] healthcare provider. People who live with or visit you should also wear [name] [name] facemask while they are in the same room with  you.  Separate yourself from other people in your home As much as possible, you should stay in [name] [name] different room from other people in your home. Also, you should use [name] [name] separate bathroom, if available.  Avoid sharing household items You should not share dishes, drinking glasses, cups, eating utensils, towels, bedding, or other items with other people in your home. After using these items, you should wash them thoroughly with soap and water.  Cover your coughs and sneezes Cover your mouth and nose with [name] [name] tissue when you cough or sneeze, or you can cough or sneeze into your sleeve. Throw used tissues in [name] [name] lined trash can, and immediately wash your hands with soap and water for at least 20 seconds or use an alcohol-based hand rub.  Wash your [name] [name] your hands often and thoroughly with soap and water for at least 20 seconds. You can use an alcohol-based hand sanitizer if soap and water are not available and if your hands are not visibly dirty. Avoid touching your eyes, nose, and mouth with unwashed hands.   Prevention Steps for Caregivers and Household Members of Individuals Confirmed to have, or Being Evaluated for, COVID-19 Infection Being Cared for in the Home  If you live with, or provide care at home for, [name] [name] person confirmed to have, or being evaluated for, COVID-19 infection please follow these guidelines to prevent infection:  Follow healthcare provider's instructions Make sure that you understand and can help the patient follow any healthcare provider instructions for all care.  Provide for the patient's basic needs You should help the patient with basic needs in the home and provide support for  getting groceries, prescriptions, and other personal needs.  Monitor the patient's symptoms If they are getting sicker, call his or her medical provider and tell them that the patient has, or is being evaluated for, COVID-19 infection. This will help the healthcare provider's office  take steps to keep other people from getting infected. Ask the healthcare provider to call the local or state health department.  Limit the number of people who have contact with the patient  If possible, have only one caregiver for the patient.  Other household members should stay in another home or place of residence. If this is not possible, they should stay  in another room, or be separated from the patient as much as possible. Use [name] [name] separate bathroom, if available.  Restrict visitors who do not have an essential need to be in the home.  Keep older adults, very young children, and other sick people away from the patient Keep older adults, very young children, and those who have compromised immune systems or chronic health conditions away from the patient. This includes people with chronic heart, lung, or kidney conditions, diabetes, and cancer.  Ensure good ventilation Make sure that shared spaces in the home have good air flow, such as from an air conditioner or an opened window, weather permitting.  Wash your hands often  Wash your hands often and thoroughly with soap and water for at least 20 seconds. You can use an alcohol based hand sanitizer if soap and water are not available and if your hands are not visibly dirty.  Avoid touching your eyes, nose, and mouth with unwashed hands.  Use disposable paper towels to dry your hands. If not available, use dedicated cloth towels and replace them when they become wet.  Wear [name] [name] facemask and gloves  Wear [name] [name] disposable facemask at all times in the room and gloves when you touch or have contact with the patient's blood, body fluids, and/or secretions or excretions, such as sweat, saliva, sputum, nasal mucus, vomit, urine, or feces.  Ensure the mask fits over your nose and mouth tightly, and do not touch it during use.  Throw out disposable facemasks and gloves after using them. Do not reuse.  Wash your hands immediately after removing  your facemask and gloves.  If your personal clothing becomes contaminated, carefully remove clothing and launder. Wash your hands after handling contaminated clothing.  Place all used disposable facemasks, gloves, and other waste in Carmell Elgin lined container before disposing them with other household waste.  Remove gloves and wash your hands immediately after handling these items.  Do not share dishes, glasses, or other household items with the patient  Avoid sharing household items. You should not share dishes, drinking glasses, cups, eating utensils, towels, bedding, or other items with Giulia Hickey patient who is confirmed to have, or being evaluated for, COVID-19 infection.  After the person uses these items, you should wash them thoroughly with soap and water.  Wash laundry thoroughly  Immediately remove and wash clothes or bedding that have blood, body fluids, and/or secretions or excretions, such as sweat, saliva, sputum, nasal mucus, vomit, urine, or feces, on them.  Wear gloves when handling laundry from the patient.  Read and follow directions on labels of laundry or clothing items and detergent. In general, wash and dry with the warmest temperatures recommended on the label.  Clean all areas the individual has used often  Clean all touchable surfaces, such as counters, tabletops, doorknobs, bathroom fixtures, toilets, phones, keyboards, tablets, and bedside  tables, every day. Also, clean any surfaces that may have blood, body fluids, and/or secretions or excretions on them.  Wear gloves when cleaning surfaces the patient has come in contact with.  Use Gianpaolo Mindel diluted bleach solution (e.g., dilute bleach with 1 part bleach and 10 parts water) or Ezme Duch household disinfectant with Eleanor Dimichele label that says EPA-registered for coronaviruses. To make Sonam Huelsmann bleach solution at home, add 1 tablespoon of bleach to 1 quart (4 cups) of water. For Jacobi Nile larger supply, add  cup of bleach to 1 gallon (16 cups) of water.  Read labels  of cleaning products and follow recommendations provided on product labels. Labels contain instructions for safe and effective use of the cleaning product including precautions you should take when applying the product, such as wearing gloves or eye protection and making sure you have good ventilation during use of the product.  Remove gloves and wash hands immediately after cleaning.  Monitor yourself for signs and symptoms of illness Caregivers and household members are considered close contacts, should monitor their health, and will be asked to limit movement outside of the home to the extent possible. Follow the monitoring steps for close contacts listed on the symptom monitoring form.   ? If you have additional questions, contact your local health department or call the epidemiologist on call at [phone] (available 24/7). ? This guidance is subject to change. For the most up-to-date guidance from [facility], please refer to their website: YouBlogs.pl

## 2019-06-05 NOTE — Discharge Summary (Signed)
Physician Discharge Summary  [name] [name] IEP:[id number] DOB: 1970/05/29 DOA: 05/31/2019  PCP: System, Pcp Not In  Admit date: 05/31/2019 Discharge date: 06/05/2019   Time spent: 40 minutes  Recommendations for Outpatient Follow-up:  1. Follow outpatient CBC/CMP 2. Follow CDC quarantine guidelines  3. Follow prediabetes outpatient    Discharge Diagnoses:  Principal Problem:   Pneumonia due to COVID-19 virus Active Problems:   Asthma   History of Roux-en-Y gastric bypass   Bipolar disorder, in partial remission, most recent episode mixed (HCC)   Acute respiratory failure with hypoxia (HCC)  Discharge Condition: stable  Diet recommendation: heart healthy  Filed Weights   06/02/19 0900  Weight: (!) 143.8 kg    History of present illness:  [name] [name] 49 y.o.femalewith medical history significant ofasthma, obstructive sleep apnea was following up in her pulmonologist office today for the affirmation problems acutely was having cough and worsening shortness of breath for the last week. She reports she is been wheezing. She does not have inhalers at home. She has loss of smell and taste and has been very weak. She denies any fevers. Patient was found to have O2 sats in the low 80s on room air so she was therefore sent to the emergency department from the pulmonology office. Patient reports she feels better with 4 to 5 L of oxygen on. Her shortness of breath feels better also. Patient be referred for admission for acute hypoxic respiratory failure secondary to Covid pneumonia.  She was admitted for COVID 19 pneumonia.  She improved with steroids and remdesivir and plasma.  She was doing well on RA on 2/2 and discharged with plans to follow outpatient.  Hospital Course:  Acute Hypoxic Respiratory Failure 2/2 COVID 19 Pneumonia:  Currently doing well on RA.  She's been on RA for 24 hours now with the lowest sat recorded being 93%.  Discussed with nursing, pt walking  in room and maintaining sats in 90's.  Did home O2 screen and sats down to 85, though on discussion with nurse, this improved immediately when patient stopped walking and improved to 90's.  She had no SOB.  Suspect some inaccuracy with motion given immediate improvement (recovered before RN was even able to apply oxygen).  As pt doing well in room, maintaining 90's walking in room, will send home without additional supplemental oxygen.  Discussed situation with patient, recommended pulse ox for home, she was agreeable with this.   Completed steroids and remdesivir. S/p plasma 1/30  I/O, daily weights IS, OOB, prone as able CRP significantly elevated D dimer elevated, follow LE [place] (negative) Transition to weight based lovenox CXR with mild improvement in bilateral patchy multifocal airpsace opacities, cardiomegaly with mild pulm interstitial congestion -> BNP wnl, appears euvolemic on exam  COVID-19 Labs  Recent Labs    06/03/19 0426 06/04/19 0353 06/05/19 0530  DDIMER 1.64* 1.53* 1.47*  FERRITIN 64 51 38  CRP 6.4* 3.1* 1.3*    No results found for: SARSCOV2NAA  Asthma: continue prn nebs  Hx Roux en y gastric bypass: noted  Bipolar Disorder: noted  Prediabetes  Stress and steroid induced hyperglycemia: SSI, a1c 6.1  Elevated Liver Enzymes: 2/2 COVID  Leukocytosis: likely 2/2 steroids, follow  Thrombocytosis: likely related to acute infection, ctm  Procedures: LE [place] Summary:  Right: There is no evidence of deep vein thrombosis in the lower  extremity. However, portions of this examination were limited- see  technologist comments above. No cystic structure found in the popliteal  fossa.  Left: There is no evidence of deep vein thrombosis in the lower extremity.  However, portions of this examination were limited- see technologist  comments above. No cystic structure found in the popliteal fossa.   Consultations:  none  Discharge Exam: Vitals:   06/05/19 0559  06/05/19 0749  BP: (!) 144/96 (!) 147/102  Pulse: 78 81  Resp:  16  Temp:  98.6 F (37 C)  SpO2:  94%   Feels well Ready to go home Prefers not to go home with any supplemental o2  General: No acute distress. Cardiovascular: Heart sounds show Chue Berkovich regular rate, and rhythm.  Lungs: Clear to auscultation bilaterally  Abdomen: Soft, nontender, nondistended  Neurological: Alert and oriented 3. Moves all extremities 4 . Cranial nerves II through XII grossly intact. Skin: Warm and dry. No rashes or lesions. Extremities: No clubbing or cyanosis. No edema.    Discharge Instructions   Discharge Instructions    Call MD for:  difficulty breathing, headache or visual disturbances   Complete by: As directed    Call MD for:  extreme fatigue   Complete by: As directed    Call MD for:  hives   Complete by: As directed    Call MD for:  persistant dizziness or light-headedness   Complete by: As directed    Call MD for:  persistant nausea and vomiting   Complete by: As directed    Call MD for:  redness, tenderness, or signs of infection (pain, swelling, redness, odor or green/yellow discharge around incision site)   Complete by: As directed    Call MD for:  severe uncontrolled pain   Complete by: As directed    Call MD for:  temperature >100.4   Complete by: As directed    Diet - low sodium heart healthy   Complete by: As directed    Discharge instructions   Complete by: As directed    You were seen for COVID 19 pneumonia.  You've improved with steroids, remdesivir, and plasma.  You need to continue to quarantine for 21 days since your positive test.  You can discontinue your quarantine after February 18th if you continue to improve without the use of medications.  Call the health department or your PCP with questions.  You have prediabetes, follow up with your PCP regarding this.  Please follow up with Paislynn Hegstrom tele medicine visit with your PCP.    Return for new, recurrent, or worsening  symptoms.  Please ask your PCP to request records from this hospitalization so they know what was done and what the next steps will be.   Increase activity slowly   Complete by: As directed      Allergies as of 06/05/2019      Reactions   Amoxicillin Other (See Comments)      Medication List    STOP taking these medications   meloxicam 7.5 MG tablet Commonly known as: MOBIC   naproxen sodium 220 MG tablet Commonly known as: ALEVE     TAKE these medications   buPROPion 100 MG tablet Commonly known as: WELLBUTRIN Take 100 mg by mouth 3 (three) times daily.   clindamycin 1 % lotion Commonly known as: CLEOCIN T Apply topically 2 (two) times daily.   terbinafine 250 MG tablet Commonly known as: LamISIL Take 1 tablet (250 mg total) by mouth daily.   topiramate 100 MG tablet Commonly known as: TOPAMAX Take 100 mg by mouth daily.   tretinoin 0.025 % cream Commonly  known as: RETIN-Hezzie Karim Apply topically at bedtime.   Vyvanse 70 MG capsule Generic drug: lisdexamfetamine Take 70 mg by mouth daily.      Allergies  Allergen Reactions  . Amoxicillin Other (See Comments)      The results of significant diagnostics from this hospitalization (including imaging, microbiology, ancillary and laboratory) are listed below for reference.    Significant Diagnostic Studies: DG CHEST PORT 1 VIEW  Result Date: 06/03/2019 CLINICAL DATA:  Initial evaluation for hypoxia, COVID positive. EXAM: PORTABLE CHEST 1 VIEW COMPARISON:  Prior radiograph from 05/31/2019. FINDINGS: Stable cardiomegaly. Mediastinal silhouette within normal limits. Lungs normally inflated. Previously seen patchy multifocal airspace opacities appear mildly improved from previous. Underlying mild pulmonary interstitial congestion without overt pulmonary edema. No pleural effusion. No pneumothorax. No acute osseous finding. IMPRESSION: 1. Mild interval improvement in bilateral patchy multifocal airspace opacities, suggesting  improving pneumonia. 2. Stable cardiomegaly with underlying mild pulmonary interstitial congestion. Electronically Signed   By: Jeannine Boga M.D.   On: 06/03/2019 07:33   DG Chest Port 1 View  Result Date: 05/31/2019 CLINICAL DATA:  Shortness of breath and fever. EXAM: PORTABLE CHEST 1 VIEW COMPARISON:  July 29, 2006 FINDINGS: Moderate severity patchy infiltrates are seen within the right lung base and periphery of the mid and upper right lung. There is no evidence of Hazelene Doten pleural effusion or pneumothorax. There is mild to moderate severity enlargement of the cardiac silhouette. Multilevel degenerative changes seen throughout the thoracic spine. IMPRESSION: 1. Moderate severity right-sided infiltrate. Electronically Signed   By: Virgina Norfolk M.D.   On: 05/31/2019 21:53   VAS [place] LOWER EXTREMITY VENOUS (DVT)  Result Date: 06/02/2019  Lower Venous Study Indications: D dimer.  Limitations: Body habitus, poor ultrasound/tissue interface and pain tolerance, light interference. Comparison Study: no prior Performing Technologist: Abram Sander RVS  Examination Guidelines: Viraj Liby complete evaluation includes B-mode imaging, spectral Doppler, color Doppler, and power Doppler as needed of all accessible portions of each vessel. Bilateral testing is considered an integral part of Giacomo Valone complete examination. Limited examinations for reoccurring indications may be performed as noted.  +---------+---------------+---------+-----------+----------+--------------+ RIGHT    CompressibilityPhasicitySpontaneityPropertiesThrombus Aging +---------+---------------+---------+-----------+----------+--------------+ CFV      Full           Yes      Yes                                 +---------+---------------+---------+-----------+----------+--------------+ SFJ      Full                                                        +---------+---------------+---------+-----------+----------+--------------+ FV Prox   Full                                                        +---------+---------------+---------+-----------+----------+--------------+ FV Mid                  Yes      Yes                                 +---------+---------------+---------+-----------+----------+--------------+  FV Distal               Yes      Yes                                 +---------+---------------+---------+-----------+----------+--------------+ PFV      Full                                                        +---------+---------------+---------+-----------+----------+--------------+ POP      Full           Yes      Yes                                 +---------+---------------+---------+-----------+----------+--------------+ PTV      Full                                                        +---------+---------------+---------+-----------+----------+--------------+ PERO                                                  Not visualized +---------+---------------+---------+-----------+----------+--------------+   +---------+---------------+---------+-----------+----------+--------------+ LEFT     CompressibilityPhasicitySpontaneityPropertiesThrombus Aging +---------+---------------+---------+-----------+----------+--------------+ CFV      Full           Yes      Yes                                 +---------+---------------+---------+-----------+----------+--------------+ SFJ      Full                                                        +---------+---------------+---------+-----------+----------+--------------+ FV Prox  Full                                                        +---------+---------------+---------+-----------+----------+--------------+ FV Mid   Full                                                        +---------+---------------+---------+-----------+----------+--------------+ FV Distal                                              Not visualized +---------+---------------+---------+-----------+----------+--------------+ PFV  Full                                                        +---------+---------------+---------+-----------+----------+--------------+ POP      Full           Yes      Yes                                 +---------+---------------+---------+-----------+----------+--------------+ PTV      Full                                                        +---------+---------------+---------+-----------+----------+--------------+ PERO                                                  Not visualized +---------+---------------+---------+-----------+----------+--------------+     Summary: Right: There is no evidence of deep vein thrombosis in the lower extremity. However, portions of this examination were limited- see technologist comments above. No cystic structure found in the popliteal fossa. Left: There is no evidence of deep vein thrombosis in the lower extremity. However, portions of this examination were limited- see technologist comments above. No cystic structure found in the popliteal fossa.  *See table(s) above for measurements and observations. Electronically signed by Lemar LivingsBrandon Cain MD on 06/02/2019 at 8:42:16 AM.    Final     Microbiology: Recent Results (from the past 240 hour(s))  Blood Culture (routine x 2)     Status: None   Collection Time: 05/31/19 10:15 PM   Specimen: BLOOD  Result Value Ref Range Status   Specimen Description BLOOD LEFT ANTECUBITAL  Final   Special Requests   Final    BOTTLES DRAWN AEROBIC AND ANAEROBIC Blood Culture adequate volume Performed at [facility], [address] [place], [place] [address]    Culture NO GROWTH 5 DAYS  Final   Report Status 06/05/2019 FINAL  Final  Blood Culture (routine x 2)     Status: None   Collection Time: 05/31/19 10:15 PM   Specimen: BLOOD LEFT HAND  Result Value Ref Range Status   Specimen  Description BLOOD LEFT HAND  Final   Special Requests   Final    BOTTLES DRAWN AEROBIC AND ANAEROBIC Blood Culture results may not be optimal due to an inadequate volume of blood received in culture bottles Performed at [facility], [address] [place], [place] [address]    Culture NO GROWTH 5 DAYS  Final   Report Status 06/05/2019 FINAL  Final     Labs: Basic Metabolic Panel: Recent Labs  Lab 06/01/19 0854 06/02/19 0229 06/03/19 0426 06/04/19 0353 06/05/19 0530  NA 138 141 141 141 140  K 4.0 4.0 3.9 3.9 3.6  CL 104 107 104 103 104  CO2 23 27 27 26 27   GLUCOSE 229* 161* 108* 121* 141*  BUN 12 18 20 16 16   CREATININE 0.71 0.68 0.70 0.78 0.79  CALCIUM  8.9 9.2 8.6* 8.9 8.8*  MG 2.0  --  2.1 2.1 2.1  PHOS 3.7  --  4.4 4.0 3.6   Liver Function Tests: Recent Labs  Lab 06/01/19 0854 06/02/19 0229 06/03/19 0426 06/04/19 0353 06/05/19 0530  AST 19 23 46* 29 24  ALT 18 21 45* 45* 42  ALKPHOS 102 93 89 87 82  BILITOT 0.2* 0.3 0.2* 0.2* 0.3  PROT 8.2* 7.9 7.3 7.5 7.0  ALBUMIN 3.0* 2.9* 2.9* 3.0* 2.9*   No results for input(s): LIPASE, AMYLASE in the last 168 hours. No results for input(s): AMMONIA in the last 168 hours. CBC: Recent Labs  Lab 05/31/19 2215 05/31/19 2215 06/01/19 0854 06/02/19 0229 06/03/19 0426 06/04/19 0353 06/05/19 0530  WBC 9.4   < > 8.8 14.9* 23.4* 19.9* 21.2*  NEUTROABS 6.6  --   --  12.2* 19.5* 15.8* 16.1*  HGB 10.4*   < > 11.1* 10.3* 10.3* 11.5* 11.3*  HCT 34.7*   < > 36.9 35.4* 34.7* 39.1 37.4  MCV 86.8   < > 87.2 88.1 88.1 88.5 86.6  PLT 450*   < > 522* 549* 591* 661* 629*   < > = values in this interval not displayed.   Cardiac Enzymes: No results for input(s): CKTOTAL, CKMB, CKMBINDEX, TROPONINI in the last 168 hours. BNP: BNP (last 3 results) Recent Labs    06/03/19 0426  BNP 32.8    ProBNP (last 3 results) No results for input(s): PROBNP in the last 8760 hours.  CBG: Recent Labs  Lab 06/04/19 0740  06/04/19 1150 06/04/19 1627 06/04/19 2232 06/05/19 0803  GLUCAP 115* 141* 100* 129* 149*       Signed:  Lacretia Nicks MD.  [facility] 06/05/2019, 11:18 AM

## 2019-06-05 NOTE — TOC Transition Note (Signed)
Transition of Care [facility]) - CM/SW Discharge Note   Patient Details  Name: [name] [name] MRN: [id number] Date of Birth: Mar 03, 1971  [facility] CM/SW Contact:  Golda Acre, RN Phone Number: 06/05/2019, 11:21 AM   Clinical Narrative:    ptar called for transport to [address] at 112[address].  RN is aware.   Final next level of care: Home/Self Care Barriers to Discharge: No Barriers Identified   Patient Goals and CMS Choice Patient states their goals for this hospitalization and ongoing recovery are:: to goh ome CMS Medicare.gov Compare Post Acute Care list provided to:: Patient    Discharge Placement                       Discharge Plan and Services   Discharge Planning Services: CM Consult                                 Social Determinants of Health (SDOH) Interventions     Readmission Risk Interventions No flowsheet data found.

## 2019-06-06 ENCOUNTER — Encounter: Payer: Self-pay | Admitting: Internal Medicine

## 2019-06-07 ENCOUNTER — Telehealth: Payer: Self-pay

## 2019-06-07 NOTE — Telephone Encounter (Signed)
Spoke with pt to do TCM call and schedule appt. Pt was very unpleasant. She was frustrated with being asked questions and was reluctant at first to schedule virtual visit with provider. tco call performed and appt made.

## 2019-06-07 NOTE — Telephone Encounter (Signed)
Transition Care Management Follow-up Telephone Call  Date of discharge and from where:06/05/2019  How have you been since you were released from the hospital? "ive been alright"  Any questions or concerns? no  Items Reviewed:  Did the pt receive and understand the discharge instructions provided? Yes   Medications obtained and verified? no  Any new allergies since your discharge? no  Dietary orders reviewed? no  Do you have support at home? no  Other (ie: DME, Home Health, etc) no  Functional Questionnaire: (I = Independent and D = Dependent) ADL's:i  Bathing/Dressing- i   Meal Prep- i  Eating- i  Maintaining continence- i  Transferring/Ambulation-i  Managing Meds- i   Follow up appointments reviewed:    PCP Hospital f/u appt confirmed? Yes    Specialist Hospital f/u appt confirmed?no  Are transportation arrangements needed? no  If their condition worsens, is the pt aware to call  their PCP or go to the ED? Yes   Was the patient provided with contact information for the PCP's office or ED?yes  Was the pt encouraged to call back with questions or concerns? Yes

## 2019-06-13 ENCOUNTER — Telehealth (INDEPENDENT_AMBULATORY_CARE_PROVIDER_SITE_OTHER): Payer: BC Managed Care – PPO | Admitting: Internal Medicine

## 2019-06-13 ENCOUNTER — Other Ambulatory Visit: Payer: Self-pay

## 2019-06-13 ENCOUNTER — Encounter: Payer: Self-pay | Admitting: Internal Medicine

## 2019-06-13 DIAGNOSIS — U071 COVID-19: Secondary | ICD-10-CM

## 2019-06-13 DIAGNOSIS — Z09 Encounter for follow-up examination after completed treatment for conditions other than malignant neoplasm: Secondary | ICD-10-CM | POA: Diagnosis not present

## 2019-06-13 DIAGNOSIS — J1282 Pneumonia due to coronavirus disease 2019: Secondary | ICD-10-CM | POA: Diagnosis not present

## 2019-06-13 DIAGNOSIS — Z6841 Body Mass Index (BMI) 40.0 and over, adult: Secondary | ICD-10-CM

## 2019-06-13 NOTE — Progress Notes (Signed)
Virtual Visit via Phone   This visit type was conducted due to national recommendations for restrictions regarding the COVID-19 Pandemic (e.g. social distancing) in an effort to limit this patient's exposure and mitigate transmission in our community.  Due to her co-morbid illnesses, this patient is at least at moderate risk for complications without adequate follow up.  This format is felt to be most appropriate for this patient at this time.  All issues noted in this document were discussed and addressed.  A limited physical exam was performed with this format.    This visit type was conducted due to national recommendations for restrictions regarding the COVID-19 Pandemic (e.g. social distancing) in an effort to limit this patient's exposure and mitigate transmission in our community.  Patients identity confirmed using two different identifiers.  This format is felt to be most appropriate for this patient at this time.  All issues noted in this document were discussed and addressed.  No physical exam was performed (except for noted visual exam findings with Video Visits).    Date:  06/13/2019   ID:  [name] [name], DOB [birth year], MRN [id number]  Patient Location:  Home  Provider location:   Office    Chief Complaint:  "I have hospital f/u"  History of Present Illness:    [name] [name] is a 49 y.o. female who presents via video conferencing for a telehealth visit today.    The patient does not have symptoms concerning for COVID-19 infection (fever, chills, cough, or new shortness of breath).   She presents today for virtual visit. She prefers this method of contact due to COVID-19 pandemic.  She declined to meet by video today. She presents today for hospital f/u. She reports she was admitted to [facility] on 1/28 with cough and worsening sOB. States she was initially diagnosed with COVID on 1/26 and her symptoms continued to progress including loss of taste and smell. She has PMHx  positive for asthma, morbid obesity and obstructive sleep apnea. She was in her pulmonologist office the day of admission and found to be hypoxic.  Patient was found to have O2 sats in the low 80s on room air so she was therefore sent to the emergency department for further evaluation.  Patient reports she feels better with 4 to 5 L of oxygen on.  Her shortness of breath feels better also.  Patient be referred for admission for acute hypoxic respiratory failure secondary to Covid pneumonia.  Her hospital course is significant for improvement in her sx with use of steroids, remdesivir, plasma and supplemental oxygen. She was discharged home in stable condition and room air on 06/05/19.     Past Medical History:  Diagnosis Date  . Acute pulmonary edema ([place]) 07/01/2003  . Allergic rhinitis   . Chronic respiratory failure ([place]) 07/01/2003  . Iron deficiency anemia    d/t menometrorrhagia  . Morbid obesity ([place])    bariatric surgery  . OSA (obstructive sleep apnea) 04/21/2003   Past Surgical History:  Procedure Laterality Date  . GASTRIC BYPASS  2009  . REDUCTION MAMMAPLASTY    . UTERINE FIBROID SURGERY       No outpatient medications have been marked as taking for the 06/13/19 encounter (Telemedicine) with Glendale Chard, MD.     Allergies:   Amoxicillin   Social History   Tobacco Use  . Smoking status: Never Smoker  . Smokeless tobacco: Never Used  Substance Use Topics  . Alcohol use: Yes  Comment: occasionally  . Drug use: No     Family Hx: The patient's family history includes Asthma in her father.  ROS:   Please see the history of present illness.    Review of Systems  Constitutional: Negative.   Respiratory: Negative.   Cardiovascular: Negative.   Gastrointestinal: Negative.   Neurological: Negative.   Psychiatric/Behavioral: Negative.     All other systems reviewed and are negative.   Labs/Other Tests and Data Reviewed:    Recent Labs: 12/05/2018: TSH  4.320 06/03/2019: B Natriuretic Peptide 32.8 06/05/2019: ALT 42; BUN 16; Creatinine, Ser 0.79; Hemoglobin 11.3; Magnesium 2.1; Platelets 629; Potassium 3.6; Sodium 140   Recent Lipid Panel Lab Results  Component Value Date/Time   CHOL 185 12/05/2018 02:47 PM   TRIG 66 05/31/2019 10:15 PM   HDL 72 12/05/2018 02:47 PM   CHOLHDL 2.6 12/05/2018 02:47 PM   CHOLHDL 2.1 06/13/2013 01:21 PM   LDLCALC 97 12/05/2018 02:47 PM    Wt Readings from Last 3 Encounters:  06/02/19 (!) 317 lb 0.3 oz (143.8 kg)  05/31/19 (!) 317 lb (143.8 kg)  12/05/18 298 lb 9.6 oz (135.4 kg)     Exam:    Vital Signs:  There were no vitals taken for this visit.    Physical Exam Not performed due to nature of visit.  She is able to speak in full sentences. She does not appear to have labored breathing.   ASSESSMENT & PLAN:     1. COVID-19  TCM PERFORMED. A MEMBER OF THE CLINICAL TEAM SPOKE WITH THE PATIENT UPON DISCHARGE. DISCHARGE SUMMARY WAS REVIEWED IN FULL DETAIL DURING THE VISIT. MEDS RECONCILED AND COMPARED TO DISCHARGE MEDS. MEDICATION LIST WAS UPDATED AND REVIEWED WITH THE PATIENT. GREATER THAN 50% FACE TO FACE TIME WAS SPENT IN COUNSELING AND COORDINATION OF CARE. ALL QUESTIONS WERE ANSWERED TO THE SATISFACTION OF THE PATIENT.  She is encouraged to wait at least 30 days prior to getting the COVID vaccine. Advised to not wait any longer than 90 days. Encouraged to stay well hydrated and continue to social distance and wear masks.   2. Pneumonia due to COVID-19 virus  Resolving. Advised to f/u with Pulmonary as previously scheduled.     Time spent in visit:  I personally spent 30 minutes in non-face-to-face in the care of this patient, which includes all pre-, intra-, and post visit time on the date of service.   COVID-19 Education: The signs and symptoms of COVID-19 were discussed with the patient and how to seek care for testing (follow up with PCP or arrange E-visit).  The importance of social  distancing was discussed today.  Patient Risk:   After full review of this patients clinical status, I feel that they are at least moderate risk at this time.     Medication Adjustments/Labs and Tests Ordered: Current medicines are reviewed at length with the patient today.  Concerns regarding medicines are outlined above.   Tests Ordered: No orders of the defined types were placed in this encounter.   Medication Changes: No orders of the defined types were placed in this encounter.   Disposition:  Follow up prn  Signed, Gwynneth Aliment, MD

## 2019-06-13 NOTE — Patient Instructions (Signed)

## 2019-06-28 ENCOUNTER — Ambulatory Visit (INDEPENDENT_AMBULATORY_CARE_PROVIDER_SITE_OTHER): Payer: BC Managed Care – PPO

## 2019-06-28 ENCOUNTER — Encounter: Payer: Self-pay | Admitting: Internal Medicine

## 2019-06-28 ENCOUNTER — Other Ambulatory Visit: Payer: Self-pay

## 2019-06-28 ENCOUNTER — Ambulatory Visit: Payer: BC Managed Care – PPO | Admitting: Internal Medicine

## 2019-06-28 VITALS — BP 122/76 | HR 96 | Temp 97.5°F | Ht 63.0 in | Wt 307.0 lb

## 2019-06-28 DIAGNOSIS — Z6841 Body Mass Index (BMI) 40.0 and over, adult: Secondary | ICD-10-CM

## 2019-06-28 DIAGNOSIS — G4733 Obstructive sleep apnea (adult) (pediatric): Secondary | ICD-10-CM | POA: Diagnosis not present

## 2019-06-28 DIAGNOSIS — J1282 Pneumonia due to coronavirus disease 2019: Secondary | ICD-10-CM | POA: Diagnosis not present

## 2019-06-28 DIAGNOSIS — U071 COVID-19: Secondary | ICD-10-CM | POA: Diagnosis not present

## 2019-06-28 DIAGNOSIS — J9601 Acute respiratory failure with hypoxia: Secondary | ICD-10-CM

## 2019-06-28 NOTE — Patient Instructions (Signed)
Order- CXR    Dx post Covid pneumonia  Please try to get back to using your CPAP. It is safe, won't weaken or harm you, and helps protect your brain and heart from low oxygen levels while you sleep.  Please call if we can help

## 2019-06-28 NOTE — Progress Notes (Signed)
Patient ID: [name] [name], female    DOB: 1970/08/10, 49 y.o.   MRN: [id number]  HPI female never smoker followed for OSA, asthma, obesity/hypoventilation (previous gastric bypass bariatric surgery), complicated by morbid obesity NPSG 04/21/03  AHI 101/ hr, CPAP to 18, weight was 400 lbs --------------------------------------------------------------------------------------------------  11/22/2018- 49 year old female never smoker followed for OSA, asthma, obesity/hypoventilation (previous gastric bypass bariatric surgery), complicated by morbid obesity -----OSA not currently on CPAP d/t financial issues, DME: [employer]; states she would like to be reestablished onto CPAP. Now has insurance again.  Body weight today 301 lbs Understands we need to update sleep study.  06/28/19- 49 year old female never smoker followed for OSA/ noncompliant w CPAP, asthma, obesity/hypoventilation (previous gastric bypass bariatric surgery), complicated by morbid obesity, BIPolar, Covid Pneumonia 2021,  Covid test positive 05/31/19 Hosp 1/28-06/05/19- Covid Pneumonia, Acute Resp Failure w Hypoxia, Treated steroids, Remdesivir, Convalescent Plasma, Discharged on room air.  CPAP auto 5-15/  Download 1/25-2/23-  compliance 3%, AHI 1.5/ hr Returning to work. Breathing feels back to her normal. No cough or phlegm.  CXR 1V 1/31/ 21- 1. Mild interval improvement in bilateral patchy multifocal airspace opacities, suggesting improving pneumonia. 2. Stable cardiomegaly with underlying mild pulmonary interstitial congestion.  Review of Systems + = positive Constitutional:   No-   weight loss, night sweats, fevers, chills, fatigue, lassitude. HEENT:   No-  headaches, difficulty swallowing, tooth/dental problems, sore throat,       Occasional  sneezing, itching, ear ache, nasal congestion, post nasal drip,  CV:  No-   chest pain, orthopnea, PND, swelling in lower extremities, anasarca, dizziness, palpitations Resp: No- acute   shortness of breath with exertion or at rest.              No-   productive cough,  +non-productive cough,  No- coughing up of blood.              No-   change in color of mucus.  No- wheezing.   Skin: No-   rash or lesions. GI:  No-   heartburn, indigestion, abdominal pain, nausea, vomiting,  GU:  MS:  No-   joint pain or swelling.   Neuro-     nothing unusual Psych:  No- change in mood or affect. No depression or anxiety.  No memory loss.    Objective:   Physical Exam General- Alert, Oriented, Affect-appropriate, Distress- none acute, + Morbidly obese Skin- rash-none, lesions- none, excoriation- none Lymphadenopathy- none Head- atraumatic            Eyes- Gross vision intact, PERRLA, conjunctivae clear secretions            Ears- Hearing, canals normal            Nose- Clear, No- Septal dev, mucus, polyps, erosion, perforation             Throat- Mallampati II-III , mucosa clear , drainage- none, tonsils- atrophic Neck- flexible , trachea midline, no stridor , thyroid nl, carotid no bruit Chest - symmetrical excursion , unlabored           Heart/CV- RRR , no murmur , no gallop  , no rub, nl s1 s2                           - JVD- none , edema- none, stasis changes- none, varices- none           Lung- clear to P&A, wheeze-  none,  dullness-none, rub- none                Chest wall-  Abd- Br/ Gen/ Rectal- Not done, not indicated Extrem- cyanosis- none, clubbing, none, atrophy- none, strength- nl Neuro- grossly intact to observation

## 2019-06-30 NOTE — Assessment & Plan Note (Addendum)
Remains morbidly obese. This problem is closely linked to her mood disorder. Failed bariatric surgery.

## 2019-06-30 NOTE — Assessment & Plan Note (Signed)
Clinically resolved.  

## 2019-06-30 NOTE — Assessment & Plan Note (Signed)
Never compliant. We discussed this again. CPAP is really the only practical treatment likely to help her. Encouragement and education.

## 2019-07-02 ENCOUNTER — Other Ambulatory Visit: Payer: Self-pay

## 2019-07-04 ENCOUNTER — Encounter: Payer: Self-pay | Admitting: *Deleted

## 2019-07-04 NOTE — Progress Notes (Signed)
Letter mailed to the pt. 

## 2019-07-26 ENCOUNTER — Encounter: Payer: Self-pay | Admitting: Internal Medicine

## 2019-08-02 ENCOUNTER — Ambulatory Visit: Payer: BC Managed Care – PPO | Admitting: Internal Medicine

## 2019-08-20 ENCOUNTER — Encounter: Payer: Self-pay | Admitting: Internal Medicine

## 2019-11-27 ENCOUNTER — Encounter: Payer: Self-pay | Admitting: Internal Medicine

## 2019-12-10 ENCOUNTER — Encounter: Payer: BC Managed Care – PPO | Admitting: Internal Medicine

## 2019-12-19 ENCOUNTER — Encounter: Payer: BC Managed Care – PPO | Admitting: Internal Medicine

## 2020-01-20 ENCOUNTER — Encounter: Payer: Self-pay | Admitting: Internal Medicine

## 2020-02-22 ENCOUNTER — Encounter (INDEPENDENT_AMBULATORY_CARE_PROVIDER_SITE_OTHER): Payer: Self-pay

## 2020-02-25 ENCOUNTER — Encounter: Payer: Self-pay | Admitting: Internal Medicine

## 2020-04-04 NOTE — Telephone Encounter (Signed)
Dr [name], please advise on pt email, thanks!  Do I need this new booster vaccination? I received text message vaccines.gov   PortalAlert.at: The new Omicron variant is an important reminder to get your booster

## 2020-04-04 NOTE — Telephone Encounter (Signed)
She has had 3 Phizer vaccinations, most recently in October. The third one is considered the "booster".  No one is recommending a 4th Covid vaccination at this time.

## 2020-07-18 ENCOUNTER — Ambulatory Visit: Payer: BC Managed Care – PPO | Admitting: Internal Medicine

## 2021-01-08 ENCOUNTER — Other Ambulatory Visit: Payer: Self-pay | Admitting: Internal Medicine

## 2021-01-08 DIAGNOSIS — Z1231 Encounter for screening mammogram for malignant neoplasm of breast: Secondary | ICD-10-CM

## 2021-01-15 ENCOUNTER — Ambulatory Visit: Payer: BC Managed Care – PPO | Admitting: Primary Care

## 2021-01-17 ENCOUNTER — Encounter: Payer: Self-pay | Admitting: Internal Medicine

## 2021-02-12 ENCOUNTER — Ambulatory Visit
Admission: RE | Admit: 2021-02-12 | Discharge: 2021-02-12 | Disposition: A | Discharge status: Home / Self Care | Payer: BC Managed Care – PPO | Source: Ambulatory Visit | Attending: Internal Medicine | Admitting: Internal Medicine

## 2021-02-12 ENCOUNTER — Other Ambulatory Visit: Payer: Self-pay

## 2021-02-12 DIAGNOSIS — Z1231 Encounter for screening mammogram for malignant neoplasm of breast: Secondary | ICD-10-CM

## 2021-02-25 ENCOUNTER — Other Ambulatory Visit: Payer: Self-pay

## 2021-02-25 ENCOUNTER — Encounter: Payer: Self-pay | Admitting: Nurse Practitioner

## 2021-02-25 ENCOUNTER — Encounter: Payer: BC Managed Care – PPO | Admitting: Nurse Practitioner

## 2021-02-25 ENCOUNTER — Ambulatory Visit (INDEPENDENT_AMBULATORY_CARE_PROVIDER_SITE_OTHER): Payer: BC Managed Care – PPO | Admitting: Nurse Practitioner

## 2021-02-25 VITALS — BP 128/80 | HR 75 | Temp 98.2°F | Ht 63.4 in | Wt 310.6 lb

## 2021-02-25 DIAGNOSIS — M25562 Pain in left knee: Secondary | ICD-10-CM

## 2021-02-25 DIAGNOSIS — Z1211 Encounter for screening for malignant neoplasm of colon: Secondary | ICD-10-CM

## 2021-02-25 DIAGNOSIS — F3177 Bipolar disorder, in partial remission, most recent episode mixed: Secondary | ICD-10-CM

## 2021-02-25 DIAGNOSIS — M25561 Pain in right knee: Secondary | ICD-10-CM

## 2021-02-25 DIAGNOSIS — G8929 Other chronic pain: Secondary | ICD-10-CM

## 2021-02-25 DIAGNOSIS — Z6841 Body Mass Index (BMI) 40.0 and over, adult: Secondary | ICD-10-CM

## 2021-02-25 NOTE — Patient Instructions (Signed)
Acute Knee Pain, Adult Acute knee pain is sudden and may be caused by damage, swelling, or irritation of the muscles and tissues that support the knee. Pain may result from: A fall. An injury to the knee from twisting motions. A hit to the knee. Infection. Acute knee pain may go away on its own with time and rest. If it does not, your health care provider may order tests to find the cause of the pain. These may include: Imaging tests, such as an X-ray, MRI, CT scan, or ultrasound. Joint aspiration. In this test, fluid is removed from the knee and evaluated. Arthroscopy. In this test, a lighted tube is inserted into the knee and an image is projected onto a TV screen. Biopsy. In this test, a sample of tissue is removed from the body and studied under a microscope. Follow these instructions at home: If you have a knee sleeve or brace:  Wear the knee sleeve or brace as told by your health care provider. Remove it only as told by your health care provider. Loosen it if your toes tingle, become numb, or turn cold and blue. Keep it clean. If the knee sleeve or brace is not waterproof: Do not let it get wet. Cover it with a watertight covering when you take a bath or shower.  Activity Rest your knee. Do not do things that cause pain or make pain worse. Avoid high-impact activities or exercises, such as running, jumping rope, or doing jumping jacks. Work with a physical therapist to make a safe exercise program, as recommended by your health care provider. Do exercises as told by your physical therapist. Managing pain, stiffness, and swelling  If directed, put ice on the affected knee. To do this: If you have a removable knee sleeve or brace, remove it as told by your health care provider. Put ice in a plastic bag. Place a towel between your skin and the bag. Leave the ice on for 20 minutes, 2-3 times a day. Remove the ice if your skin turns bright red. This is very important. If you cannot  feel pain, heat, or cold, you have a greater risk of damage to the area. If directed, use an elastic bandage to put pressure (compression) on your injured knee. This may control swelling, give support, and help with discomfort. Raise (elevate) your knee above the level of your heart while you are sitting or lying down. Sleep with a pillow under your knee.  General instructions Take over-the-counter and prescription medicines only as told by your health care provider. Do not use any products that contain nicotine or tobacco, such as cigarettes, e-cigarettes, and chewing tobacco. If you need help quitting, ask your health care provider. If you are overweight, work with your health care provider and a dietitian to set a weight-loss goal that is healthy and reasonable for you. Extra weight can put pressure on your knee. Pay attention to any changes in your symptoms. Keep all follow-up visits. This is important. Contact a health care provider if: Your knee pain continues, changes, or gets worse. You have a fever along with knee pain. Your knee feels warm to the touch or is red. Your knee buckles or locks up. Get help right away if: Your knee swells, and the swelling becomes worse. You cannot move your knee. You have severe pain in your knee that cannot be managed with pain medicine. Summary Acute knee pain can be caused by a fall, an injury, an infection, or damage, swelling,   or irritation of the tissues that support your knee. Your health care provider may perform tests to find out the cause of the pain. Pay attention to any changes in your symptoms. Relieve your pain with rest, medicines, light activity, and the use of ice. Get help right away if your knee swells, you cannot move your knee, or you have severe pain that cannot be managed with medicine. This information is not intended to replace advice given to you by your health care provider. Make sure you discuss any questions you have with  your healthcare provider. Document Revised: 10/03/2019 Document Reviewed: 10/03/2019 Elsevier Patient Education  2022 Elsevier Inc.  

## 2021-02-25 NOTE — Progress Notes (Signed)
I,Tianna Badgett,acting as a Neurosurgeon for [employer], NP.,have documented all relevant documentation on the behalf of [employer], NP,as directed by  Charlesetta Ivory, NP while in the presence of Charlesetta Ivory, NP.  This visit occurred during the SARS-CoV-2 public health emergency.  Safety protocols were in place, including screening questions prior to the visit, additional usage of staff PPE, and extensive cleaning of exam room while observing appropriate contact time as indicated for disinfecting solutions.  Subjective:     Patient ID: [name] [name] , female    DOB: [birth year] , 50 y.o.   MRN: [id number]   Chief Complaint  Patient presents with   Knee Pain   HPI  Patient is here for follow up. She has not been seen in our office since 2018. She states that she was struggling with anxiety and depression. She is still having issues with anxiety and depression but she has stopped all of her medication. She had TEMs treatment and would like to continue getting that for her depression. She is currently seeing ortho for her knee pain.  She is wanting to know more about CBD oil and wanted recommendation. I told her we don't recommend here and she states that she will see a holistic provider. She has had a partial hysterectomy. She will make an appt to come back for a physical exam.   Knee Pain  The incident occurred more than 1 week ago. There was no injury mechanism. The pain is present in the left knee and right knee. The quality of the pain is described as burning. The patient is experiencing no pain. The pain has been Constant since onset. She reports no foreign bodies present. The symptoms are aggravated by movement.    Past Medical History:  Diagnosis Date   Acute pulmonary edema ([facility]) 07/01/2003   Allergic rhinitis    Chronic respiratory failure ([facility]) 07/01/2003   Iron deficiency anemia    d/t menometrorrhagia   Morbid obesity ([facility])    bariatric surgery   OSA  (obstructive sleep apnea) 04/21/2003     Family History  Problem Relation Age of Onset   Asthma Father      Current Outpatient Medications:    clindamycin (CLEOCIN T) 1 % lotion, Apply topically 2 (two) times daily., Disp: , Rfl:    tretinoin (RETIN-A) 0.025 % cream, Apply topically at bedtime., Disp: , Rfl:    VYVANSE 70 MG capsule, Take 70 mg by mouth daily. , Disp: , Rfl: 0   Allergies  Allergen Reactions   Amoxicillin Other (See Comments)     Review of Systems  Constitutional: Negative.  Negative for chills and fever.  HENT:  Negative for congestion.   Respiratory: Negative.  Negative for cough, shortness of breath and wheezing.   Cardiovascular: Negative.  Negative for chest pain and palpitations.  Gastrointestinal:  Negative for constipation and diarrhea.  Endocrine: Negative for polydipsia and polyphagia.  Musculoskeletal:  Positive for arthralgias. Negative for myalgias.  Neurological: Negative.   Psychiatric/Behavioral:         History of depression and anxiety     Today's Vitals   02/25/21 1003  BP: 128/80  Pulse: 75  Temp: 98.2 F (36.8 C)  TempSrc: Oral  Weight: (!) 310 lb 9.6 oz (140.9 kg)  Height: 5' 3.4" (1.61 m)   Body mass index is 54.33 kg/m.  Wt Readings from Last 3 Encounters:  02/25/21 (!) 310 lb 9.6 oz (140.9 kg)  06/28/19 (!) 307 lb (139.3 kg)  06/02/19 (!) 317 lb 0.3 oz (143.8 kg)    Objective:  Physical Exam Constitutional:      Appearance: Normal appearance.  HENT:     Head: Normocephalic and atraumatic.  Cardiovascular:     Rate and Rhythm: Normal rate and regular rhythm.     Pulses: Normal pulses.     Heart sounds: Normal heart sounds. No murmur heard. Pulmonary:     Effort: Pulmonary effort is normal. No respiratory distress.     Breath sounds: Normal breath sounds. No stridor. No wheezing.  Skin:    General: Skin is warm and dry.     Capillary Refill: Capillary refill takes less than 2 seconds.  Neurological:     Mental  Status: She is alert.       Assessment And Plan:     1. Bipolar disorder, in partial remission, most recent episode mixed (HCC) -She is seeing a psychiatrist and gets TEM treatments.  -She wants to see a holistic provider to get recommendation on CBD treatments   2. Chronic pain of both knees -She sess a ortho specialist  3. Screen for colon cancer - Ambulatory referral to Gastroenterology  4. Class 3 severe obesity due to excess calories with serious comorbidity and body mass index (BMI) of 50.0 to 59.9 in adult [facility]) Advised patient on a healthy diet including avoiding fast food and red meats. Increase the intake of lean meats including grilled chicken and [place].  Drink a lot of water. Decrease intake of fatty foods. Exercise for 30-45 min. 4-5 a week to decrease the risk of cardiac event.   The patient was encouraged to call or send a message through MyChart for any questions or concerns.   Follow up: if symptoms persist or do not get better.   Side effects and appropriate use of all the medication(s) were discussed with the patient today. Patient advised to use the medication(s) as directed by their healthcare provider. The patient was encouraged to read, review, and understand all associated package inserts and contact our office with any questions or concerns. The patient accepts the risks of the treatment plan and had an opportunity to ask questions.   Patient was given opportunity to ask questions. Patient verbalized understanding of the plan and was able to repeat key elements of the plan. All questions were answered to their satisfaction.  Raman Lis Savitt, DNP   I, Raman Assata Juncaj have reviewed all documentation for this visit. The documentation on 02/25/21 for the exam, diagnosis, procedures, and orders are all accurate and complete.   IF YOU HAVE BEEN REFERRED TO A SPECIALIST, IT MAY TAKE 1-2 WEEKS TO SCHEDULE/PROCESS THE REFERRAL. IF YOU HAVE NOT HEARD FROM US/SPECIALIST IN TWO  WEEKS, PLEASE GIVE [place] A CALL AT [phone] X [phone].   THE PATIENT IS ENCOURAGED TO PRACTICE SOCIAL DISTANCING DUE TO THE COVID-19 PANDEMIC.

## 2021-03-12 NOTE — Progress Notes (Signed)
Patient ID: [name] [name], female    DOB: July 29, 1970, 50 y.o.   MRN: [id number]  HPI female never smoker followed for OSA, asthma, obesity/hypoventilation (previous gastric bypass bariatric surgery), complicated by morbid obesity NPSG 04/21/03  AHI 101/ hr, CPAP to 18, weight was 400 lbs HST 12/11/18- AHI 6.3/ hr, desaturation to 80%, body weight 301 lbs --------------------------------------------------------------------------------------------------   06/28/19- 50 year old female never smoker followed for OSA/ noncompliant w CPAP, asthma, obesity/hypoventilation (previous gastric bypass bariatric surgery), complicated by morbid obesity, BIPolar, Covid Pneumonia 2021,  Covid test positive 05/31/19 Hosp 1/28-06/05/19- Covid Pneumonia, Acute Resp Failure w Hypoxia, Treated steroids, Remdesivir, Convalescent Plasma, Discharged on room air.  CPAP auto 5-15/  Download 1/25-2/23-  compliance 3%, AHI 1.5/ hr Returning to work. Breathing feels back to her normal. No cough or phlegm.  CXR 1V 1/31/ 21- 1. Mild interval improvement in bilateral patchy multifocal airspace opacities, suggesting improving pneumonia. 2. Stable cardiomegaly with underlying mild pulmonary interstitial congestion.  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 She says she is ok with restarting CPAP. Discussed goals and purpose. Never wheezes now and never uses her inhaler.  No acute events. Discussed vaccinations, explaining that with her Pneumovax-23 hx and her age, she shouldn't need  repeat pneumococcal vaccination for several years, unless something changes. CXR 06/28/19-  IMPRESSION: No acute abnormalities.  Review of Systems + = positive Constitutional:   No-   weight loss, night sweats, fevers, chills, fatigue, lassitude. HEENT:   No-  headaches, difficulty swallowing, tooth/dental problems, sore throat,       Occasional  sneezing, itching, ear ache, nasal congestion, post nasal drip,  CV:  No-   chest pain, orthopnea, PND, swelling in lower extremities, anasarca, dizziness, palpitations Resp: No- acute  shortness of breath with exertion or at rest.              No-   productive cough,  +non-productive cough,  No- coughing up of blood.              No-   change in color of mucus.  No- wheezing.   Skin: No-   rash or lesions. GI:  No-   heartburn, indigestion, abdominal pain, nausea, vomiting,  GU:  MS:  No-   joint pain or swelling.   Neuro-     nothing unusual Psych:  No- change in mood or affect. No depression or anxiety.  No memory loss.    Objective:   Physical Exam General- Alert, Oriented, Affect-appropriate, Distress- none acute, + Morbidly obese Skin- rash-none, lesions- none, excoriation- none Lymphadenopathy- none Head- atraumatic            Eyes- Gross vision intact, PERRLA, conjunctivae clear secretions            Ears- Hearing, canals normal            Nose- Clear, No- Septal dev, mucus, polyps, erosion, perforation             Throat- Mallampati II-III , mucosa clear , drainage- none, tonsils- atrophic Neck- flexible , trachea midline, no stridor , thyroid nl, carotid no bruit Chest -  symmetrical excursion , unlabored           Heart/CV- RRR , no murmur , no gallop  , no rub, nl s1 s2                           - JVD- none , edema- none, stasis changes- none, varices- none           Lung- clear to P&A, wheeze- none,  dullness-none, rub- none                Chest wall-  Abd- Br/ Gen/ Rectal- Not done, not indicated Extrem- cyanosis- none,  clubbing, none, atrophy- none, strength- nl Neuro- grossly intact to observation

## 2021-03-13 ENCOUNTER — Encounter: Payer: Self-pay | Admitting: Internal Medicine

## 2021-03-13 ENCOUNTER — Ambulatory Visit (INDEPENDENT_AMBULATORY_CARE_PROVIDER_SITE_OTHER): Payer: BC Managed Care – PPO | Admitting: Internal Medicine

## 2021-03-13 ENCOUNTER — Other Ambulatory Visit: Payer: Self-pay

## 2021-03-13 DIAGNOSIS — G4733 Obstructive sleep apnea (adult) (pediatric): Secondary | ICD-10-CM | POA: Diagnosis not present

## 2021-03-13 NOTE — Patient Instructions (Signed)
If you can restart your CPAP I think that will be good for you- it takes a load off your heart.  Otherwise you are doing well  Please call if we can help

## 2021-04-05 NOTE — Assessment & Plan Note (Signed)
Failed bariatric surgery.  Consider if she would benefit from referral to a weight loss program.  Continue to encourage diet and exercise.

## 2021-04-05 NOTE — Assessment & Plan Note (Signed)
Education done.  She indicates willingness to resume CPAP on a regular basis.  We discussed medical reasons, compliance goals and alternatives to CPAP. Plan-CPAP auto 5-15

## 2021-05-05 ENCOUNTER — Encounter: Payer: Self-pay | Admitting: Nurse Practitioner

## 2021-05-19 ENCOUNTER — Other Ambulatory Visit: Payer: Self-pay

## 2021-05-19 DIAGNOSIS — L680 Hirsutism: Secondary | ICD-10-CM | POA: Insufficient documentation

## 2021-05-19 DIAGNOSIS — L739 Follicular disorder, unspecified: Secondary | ICD-10-CM | POA: Insufficient documentation

## 2021-05-19 DIAGNOSIS — L700 Acne vulgaris: Secondary | ICD-10-CM | POA: Insufficient documentation

## 2021-05-21 ENCOUNTER — Ambulatory Visit: Payer: BC Managed Care – PPO | Admitting: Nurse Practitioner

## 2021-05-21 NOTE — Progress Notes (Deleted)
05/21/2021 [name] [name] [id number] [birth year]   CHIEF COMPLAINT:   HISTORY OF PRESENT ILLNESS: [name] [name]. [name] [name] is a 51 year old female with a past medical history of asthma,  Covid Pneumonia which required hospital admission with acute respiratory failure 1/28 0 06/2019, Bipolar disorder, IDA, menorrhagia, OSA uses cpap, obesity s/p gastric bypass surgery   She presents to our office today to schedule a screening colonoscopy   CBC Latest Ref Rng & Units 06/05/2019 06/04/2019 06/03/2019  WBC 4.0 - 10.5 K/uL 21.2(H) 19.9(H) 23.4(H)  Hemoglobin 12.0 - 15.0 g/dL 11.3(L) 11.5(L) 10.3(L)  Hematocrit 36.0 - 46.0 % 37.4 39.1 34.7(L)  Platelets 150 - 400 K/uL 629(H) 661(H) 591(H)    CMP Latest Ref Rng & Units 06/05/2019 06/04/2019 06/03/2019  Glucose 70 - 99 mg/dL 141(H) 121(H) 108(H)  BUN 6 - 20 mg/dL 16 16 20   Creatinine 0.44 - 1.00 mg/dL 0.79 0.78 0.70  Sodium 135 - 145 mmol/L 140 141 141  Potassium 3.5 - 5.1 mmol/L 3.6 3.9 3.9  Chloride 98 - 111 mmol/L 104 103 104  CO2 22 - 32 mmol/L 27 26 27   Calcium 8.9 - 10.3 mg/dL 8.8(L) 8.9 8.6(L)  Total Protein 6.5 - 8.1 g/dL 7.0 7.5 7.3  Total Bilirubin 0.3 - 1.2 mg/dL 0.3 0.2(L) 0.2(L)  Alkaline Phos 38 - 126 U/L 82 87 89  AST 15 - 41 U/L 24 29 46(H)  ALT 0 - 44 U/L 42 45(H) 45(H)     Past Medical History:  Diagnosis Date   Acute pulmonary edema (HCC) 07/01/2003   Allergic rhinitis    Chronic respiratory failure (HCC) 07/01/2003   Iron deficiency anemia    d/t menometrorrhagia   Morbid obesity (HCC)    bariatric surgery   OSA (obstructive sleep apnea) 04/21/2003   Past Surgical History:  Procedure Laterality Date   GASTRIC BYPASS  2009   REDUCTION MAMMAPLASTY     UTERINE FIBROID SURGERY     Social History:  Family History:    reports that she has never smoked. She has never used smokeless tobacco. She reports current alcohol use. She reports that she does not use drugs. family history includes Asthma in her father.  Allergies   Allergen Reactions   Amoxicillin Other (See Comments)      Outpatient Encounter Medications as of 05/21/2021  Medication Sig   clindamycin (CLEOCIN T) 1 % external solution 1 application   clindamycin (CLEOCIN) 300 MG capsule Take 300 mg by mouth 4 (four) times daily.   topiramate (TOPAMAX) 100 MG tablet 100 mg in the morning and at bedtime.   tretinoin (RETIN-A) 0.025 % cream Apply topically at bedtime.   VYVANSE 70 MG capsule Take 70 mg by mouth daily.    No facility-administered encounter medications on file as of 05/21/2021.     REVIEW OF SYSTEMS:  Gen: Denies fever, sweats or chills. No weight loss.  CV: Denies chest pain, palpitations or edema. Resp: Denies cough, shortness of breath of hemoptysis.  GI: Denies heartburn, dysphagia, stomach or lower abdominal pain. No diarrhea or constipation.  GU : Denies urinary burning, blood in urine, increased urinary frequency or incontinence. MS: Denies joint pain, muscles aches or weakness. Derm: Denies rash, itchiness, skin lesions or unhealing ulcers. Psych: Denies depression, anxiety, memory loss, suicidal ideation and confusion. Heme: Denies bruising, bleeding. Neuro:  Denies headaches, dizziness or paresthesias. Endo:  Denies any problems with DM, thyroid or adrenal function.   PHYSICAL EXAM: There were no vitals  taken for this visit. General: Well developed ... in no acute distress. Head: Normocephalic and atraumatic. Eyes:  Sclerae non-icteric, conjunctive pink. Ears: Normal auditory acuity. Mouth: Dentition intact. No ulcers or lesions.  Neck: Supple, no lymphadenopathy or thyromegaly.  Lungs: Clear bilaterally to auscultation without wheezes, crackles or rhonchi. Heart: Regular rate and rhythm. No murmur, rub or gallop appreciated.  Abdomen: Soft, nontender, non distended. No masses. No hepatosplenomegaly. Normoactive bowel sounds x 4 quadrants.  Rectal:  Musculoskeletal: Symmetrical with no gross deformities. Skin:  Warm and dry. No rash or lesions on visible extremities. Extremities: No edema. Neurological: Alert oriented x 4, no focal deficits.  Psychological:  Alert and cooperative. Normal mood and affect.  ASSESSMENT AND PLAN:  60) 51 year old female presents to schedule a screening colonoscopy. BMI > 50. -Colonoscopy at [facility]  2) History of IDA since at least 2011, thought to be due to menorrhagia -EGD at time of colonoscopy, rule out anastomotic ulcer and UGI malignancy ????    CC:  [name] Castilla, NP

## 2021-06-04 ENCOUNTER — Encounter: Payer: PRIVATE HEALTH INSURANCE | Admitting: Nurse Practitioner

## 2021-06-04 NOTE — Progress Notes (Signed)
06/05/2021 [name] [name] [id number] 26-Sep-1970   CHIEF COMPLAINT: Schedule screening colonoscopy  HISTORY OF PRESENT ILLNESS:  [name] [name]. 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 S/P Roux-en-Y gastric bypass surgery 2009 at [facility], past reduction mammoplasty and a partial hysterectomy 2010. She presents to our office today as referred by Dr. Charlesetta Ivory to schedule screening colonoscopy.  She denies having any upper or lower abdominal pain.  She is passing a normal formed brown bowel movement daily.  No rectal bleeding or black stools.  No GERD symptoms she reports having a history of iron deficiency anemia associated with heavy menstrual bleeding which resolved after she underwent a partial hysterectomy in 2010.  No known family history of colorectal cancer.  Laboratory studies obtained during her hospitalization with Covid pneumonia  06/05/2019 showed mild anemia with a Hemoglobin 10.3 - 11.3.    Past Medical History:  Diagnosis Date   Acute pulmonary edema (HCC) 07/01/2003   Allergic rhinitis    Chronic respiratory failure (HCC) 07/01/2003   Iron deficiency anemia    d/t menometrorrhagia   Morbid obesity (HCC)    bariatric surgery   OSA (obstructive sleep apnea) 04/21/2003   Past Surgical History:  Procedure Laterality Date   GASTRIC BYPASS  2009   REDUCTION MAMMAPLASTY     UTERINE FIBROID SURGERY     Social History: She is divorced.  Non-smoker.  No alcohol use.  No drug use.  Family History: Father with history of asthma. No family history of esophageal, gastric or colon cancer.    Allergies  Allergen Reactions   Amoxicillin Other (See Comments)      Outpatient Encounter Medications as of 06/05/2021  Medication Sig   [DISCONTINUED] clindamycin (CLEOCIN T) 1 % external solution 1 application    [DISCONTINUED] clindamycin (CLEOCIN) 300 MG capsule Take 300 mg by mouth 4 (four) times daily.   [DISCONTINUED] topiramate (TOPAMAX) 100 MG tablet 100 mg in the morning and at bedtime.   [DISCONTINUED] tretinoin (RETIN-A) 0.025 % cream Apply topically at bedtime.   [DISCONTINUED] VYVANSE 70 MG capsule Take 70 mg by mouth daily.    No facility-administered encounter medications on file as of 06/05/2021.    REVIEW OF SYSTEMS:  Gen: Denies fever, sweats or chills. No weight loss.  CV: Denies chest pain, palpitations or edema. Resp: Denies cough, shortness of breath of hemoptysis.  GI: See HPI  GU : Denies urinary burning, blood in urine, increased urinary frequency or incontinence. MS: Denies joint pain, muscles aches or weakness. Derm: Denies rash, itchiness, skin lesions or unhealing ulcers. Psych: + Depression.  Heme: Denies bruising, bleeding. Neuro:  Denies headaches, dizziness or paresthesias. Endo:  Denies any problems with DM, thyroid or adrenal function.  PHYSICAL EXAM: BP 128/88    Pulse 89    Ht 5\' 5"  (1.651 m)    Wt (!) 322 lb (146.1 kg)    SpO2 97%    BMI 53.58 kg/m  General: 51 year old obese female in no acute distress. Head: Normocephalic and atraumatic. Eyes:  Sclerae non-icteric, conjunctive pink. Ears: Normal auditory acuity. Mouth: Dentition intact. No ulcers or lesions.  Neck: Supple, no lymphadenopathy or thyromegaly.  Lungs: Clear bilaterally to auscultation without wheezes, crackles or rhonchi. Heart: Regular rate and rhythm. No murmur, rub or gallop appreciated.  Abdomen: Soft, nontender, non distended. No masses. No hepatosplenomegaly. Normoactive bowel  sounds x 4 quadrants.  Lower abdominal horizontal scar intact. Rectal: Deferred. Musculoskeletal: Symmetrical with no gross deformities. Skin: Warm and dry. No rash or lesions on visible extremities. Extremities: No edema. Neurological: Alert oriented x 4, no focal deficits.  Psychological:  Alert and  cooperative. Normal mood and affect.  ASSESSMENT AND PLAN:  55) 51 year old female presents to discuss colon cancer screening.  -Colonoscopy at [facility]  (BMI > 50), benefits and risks discussed including risk with sedation, risk of bleeding, perforation and infection.  Patient did not wish to pursue a colonoscopy for colon cancer screening, therefore I discussed Cologuard testing.  She wishes to proceed with a Cologuard test. -Cologuard Test  2) History of IDA, previously thought to be due to menorrhagia. Hg 10 -11 per labs 03/2020 (no further labs in epic or Care Everywhere since then).  Patient is s/p partial hysterectomy.  -CBC, iron panel  3) S/P Roux-en-Y gastric bypass surgery 2009 at [facility] -EGD recommended if the above labs identify iron deficiency anemia  Further recommendations to be determined after Cologuard test and CBC/iron panel results received    CC:  Charlesetta Ivory, NP

## 2021-06-05 ENCOUNTER — Other Ambulatory Visit: Payer: BC Managed Care – PPO

## 2021-06-05 ENCOUNTER — Encounter: Payer: Self-pay | Admitting: Nurse Practitioner

## 2021-06-05 ENCOUNTER — Ambulatory Visit: Payer: BC Managed Care – PPO | Admitting: Nurse Practitioner

## 2021-06-05 VITALS — BP 128/88 | HR 89 | Ht 65.0 in | Wt 322.0 lb

## 2021-06-05 DIAGNOSIS — D509 Iron deficiency anemia, unspecified: Secondary | ICD-10-CM | POA: Diagnosis not present

## 2021-06-05 DIAGNOSIS — Z1211 Encounter for screening for malignant neoplasm of colon: Secondary | ICD-10-CM

## 2021-06-05 LAB — COMPREHENSIVE METABOLIC PANEL WITH GFR
AG Ratio: 1.4 (calc) (ref 1.0–2.5)
ALT: 10 U/L (ref 6–29)
AST: 14 U/L (ref 10–35)
Albumin: 3.8 g/dL (ref 3.6–5.1)
Alkaline phosphatase (APISO): 143 U/L (ref 37–153)
BUN: 10 mg/dL (ref 7–25)
CO2: 28 mmol/L (ref 20–32)
Calcium: 9 mg/dL (ref 8.6–10.4)
Chloride: 106 mmol/L (ref 98–110)
Creat: 0.74 mg/dL (ref 0.50–1.03)
Globulin: 2.7 g/dL (ref 1.9–3.7)
Glucose, Bld: 91 mg/dL (ref 65–99)
Potassium: 4.3 mmol/L (ref 3.5–5.3)
Sodium: 142 mmol/L (ref 135–146)
Total Bilirubin: 0.3 mg/dL (ref 0.2–1.2)
Total Protein: 6.5 g/dL (ref 6.1–8.1)

## 2021-06-05 LAB — IRON,TIBC AND FERRITIN PANEL
%SAT: 5 % — ABNORMAL LOW (ref 16–45)
Ferritin: 6 ng/mL — ABNORMAL LOW (ref 16–232)
Iron: 22 ug/dL — ABNORMAL LOW (ref 45–160)
TIBC: 455 ug/dL — ABNORMAL HIGH (ref 250–450)

## 2021-06-05 LAB — CBC
HCT: 31.8 % — ABNORMAL LOW (ref 35.0–45.0)
Hemoglobin: 9.4 g/dL — ABNORMAL LOW (ref 11.7–15.5)
MCH: 22.2 pg — ABNORMAL LOW (ref 27.0–33.0)
MCHC: 29.6 g/dL — ABNORMAL LOW (ref 32.0–36.0)
MCV: 75.2 fL — ABNORMAL LOW (ref 80.0–100.0)
MPV: 10 fL (ref 7.5–12.5)
Platelets: 478 10*3/uL — ABNORMAL HIGH (ref 140–400)
RBC: 4.23 Million/uL (ref 3.80–5.10)
RDW: 17.3 % — ABNORMAL HIGH (ref 11.0–15.0)
WBC: 6.4 10*3/uL (ref 3.8–10.8)

## 2021-06-05 NOTE — Patient Instructions (Signed)
It was my pleasure to provide care to you today. Based on our discussion, I am providing you with my recommendations below:  Your provider has ordered Cologuard testing as an option for colon cancer screening. This is performed by [employer] and may be out of network with your insurance. PRIOR to completing the test, it is YOUR responsibility to contact your insurance about covered benefits for this test. Your out of pocket expense could be anywhere from $0.00 to $649.00.   When you call to check coverage with your insurer, please provide the following information:   -The ONLY provider of Cologuard is [place] code for Cologuard is [phone].  [employer] NPI # [id number]  -[employer] Tax ID # [id number]   We have already sent your demographic and insurance information to [employer] (phone number [phone]) and they should contact you within the next week regarding your test. If you have not heard from them within the next week, please call our office at [phone].  LABS:  Lab work has been ordered for you today. Our lab is located in the basement. Press "B" on the elevator. The lab is located at the first door on the left as you exit the elevator.  HEALTHCARE LAWS AND MY CHART RESULTS:  Due to recent changes in healthcare laws, you may see the results of your imaging and laboratory studies on MyChart before your provider has had a chance to review them.   We understand that in some cases there may be results that are confusing or concerning to you. Not all laboratory results come back in the same time frame and the provider may be waiting for multiple results in order to interpret others.  Please give [place] 48 hours in order for your provider to thoroughly review all the results before contacting the office for clarification of your results.   Noralyn Pick, CRNP

## 2021-06-05 NOTE — Progress Notes (Signed)
Agree with the assessment and plan as outlined by Colleen Kennedy-Smith, NP.   Bates Collington, DO, FACG [facility]   

## 2021-06-16 ENCOUNTER — Telehealth: Payer: Self-pay | Admitting: Nurse Practitioner

## 2021-06-16 NOTE — Telephone Encounter (Signed)
[name] [name], refer to lab 06/05/2021 notes and last office visit, pls re-attempt to contact patient to schedule an EGD and colonoscopy at [facility]. Pls make sure she received prior my chart msg to start Ferrous Sulfate 325mg  QD. She needs a repeat CBC done this week as previously ordered. I will be out of the office this week, forward her repeat CBC results to Dr. to review if done. Let me know outcome. THX

## 2021-06-16 NOTE — Telephone Encounter (Signed)
Left patient a message to call back to discuss

## 2021-06-24 ENCOUNTER — Encounter: Payer: Self-pay | Admitting: Nurse Practitioner

## 2021-06-24 ENCOUNTER — Ambulatory Visit: Payer: BC Managed Care – PPO | Admitting: Nurse Practitioner

## 2021-06-24 ENCOUNTER — Other Ambulatory Visit: Payer: Self-pay

## 2021-06-24 VITALS — BP 122/68 | HR 94 | Temp 98.2°F | Ht 65.0 in | Wt 312.4 lb

## 2021-06-24 DIAGNOSIS — R5383 Other fatigue: Secondary | ICD-10-CM

## 2021-06-24 DIAGNOSIS — R41840 Attention and concentration deficit: Secondary | ICD-10-CM | POA: Diagnosis not present

## 2021-06-24 DIAGNOSIS — Z6841 Body Mass Index (BMI) 40.0 and over, adult: Secondary | ICD-10-CM

## 2021-06-24 DIAGNOSIS — Z1159 Encounter for screening for other viral diseases: Secondary | ICD-10-CM

## 2021-06-24 LAB — HM HEPATITIS C SCREENING LAB: HM Hepatitis Screen: NEGATIVE

## 2021-06-24 LAB — TSH: TSH: 2.53 (ref <=5.90)

## 2021-06-24 NOTE — Patient Instructions (Signed)
Estrogen Fractions Test Why am I having this test? The estrogen fractions test is used to evaluate: Sexual maturity, status of menopause, menstrual problems, or fertility problems in females. The health of a fetus and placenta in pregnant females. The presence of female characteristics in males (feminization syndromes). This test can also be used as a tumor marker in people with estrogen-producing tumors. What is being tested? This test measures the level of estrogen in the body. There are three major forms of estrogen: Estriol. Estriol is the major estrogen produced during pregnancy. It is produced by the placenta. Excretion increases around the eighth week of pregnancy and continues to rise until just before delivery. Low levels can be found in men and in women who are not pregnant. Estradiol. In women, the level of estradiol fluctuates monthly during the course of a normal menstrual cycle. It is produced mainly by the ovaries in women and by the testicles in men. In women, it provides a good measure of how well the ovaries are working. Estrone. Estrone is the major form of estrogen in females after menopause but can also be found in premenopausal women. It is the primary type of estrogen found in men. It can be produced by the ovaries, placenta, testicles, and fat (adipose) tissue. Estrone and estradiol are present at different levels in everyone. The combination of estrone and estradiol is called total estrogen level. What kind of sample is taken? This test can be performed using the following samples: Blood. This is usually collected by inserting a needle into a blood vessel. Urine. This is collected in a germ-free (sterile) container over a 24-hour period. The choice of sample often depends upon what your health care provider is looking for. How do I collect samples at home? Your health care provider may ask you to collect urine samples at home over a 24-hour period. When collecting a urine  sample at home, do the following: Use supplies and instructions that you received from the lab. When you get up in the morning, urinate in the toilet and flush. Write down the time. This will be your start time on the day of collection and your end time on the next morning. From then on, collect all of your urine only in the sterile plastic jug that you received from the lab. If the plastic jug that was given to you already has liquid in it, that is okay. Do not throw out the liquid or rinse out the jug. Some tests need the liquid to be added to your urine. Do not let any toilet paper or stool (feces) get into the jug. Refrigerate the sample until you can return it to the lab. Stop collecting your urine 24 hours after you started. Collect the last specimen as close as possible to the end of the 24-hour period. Return the samples to the lab as instructed. Keep the jug cool in an ice chest while you are bringing it to the lab. How do I prepare for this test? If the test requires a urine sample, make sure to drink plenty of fluids before and during the 24-hour collection period. How are the results reported? Your test results will be reported as values. Your health care provider will compare your results to normal ranges that were established after testing a large group of people (reference ranges). Reference ranges may vary among labs and hospitals. For this test, common reference ranges are: Reference ranges for estriol Female or child younger than 57 years old Urine: 1-11  mcg/24 hr. Adult female, follicular phase Urine: 0-14 mcg/24 hr. Adult female, ovulatory phase Urine: 13-54 mcg/24 hr. Adult female, luteal phase Urine: 8-60 mcg/24 hr. Adult female, postmenopausal Urine: 0-11 mcg/24 hr. Pregnant female, first trimester Serum: less than 38 ng/mL. Urine: 0-800 mcg/24 hr. Pregnant female, second trimester Serum: 38-140 ng/mL. Urine: 800-12,000 mcg/24 hr. Pregnant female, third  trimester Serum: 31-460 ng/mL. Urine: 5,000-12,000 mcg/24 hr. Reference ranges for estradiol Child younger than 37 years old Serum: less than 15 pg/mL. Urine: 0-6 mcg/24 hr. Adult female Serum: 10-50 pg/mL. Urine: 0-6 mcg/24 hr. Adult female, follicular phase Serum: 20-350 pg/mL. Urine: 0-13 mcg/24 hr. Adult female, midcycle peak Serum: 150-750 pg/mL. Urine: 4-14 mcg/24 hr. Adult female, luteal phase Serum: 30-450 pg/mL. Urine: 4-10 mcg/24 hr. Adult female, postmenopausal Serum: less than or equal to 20 pg/mL. Urine: 0-4 mcg/24 hr. Reference ranges for total estrogen Female or child younger than 74 years old Urine: 4-25 mcg/24 hr. Female, not pregnant Urine: 4-60 mcg/24 hr. Pregnant female, first trimester Urine: 0-800 mcg/24 hr. Pregnant female, second trimester Urine: 800-5,000 mcg/24 hr. Pregnant female, third trimester Urine: 5,000-50,000 mcg/24 hr. What do the results mean? Levels greater than the reference ranges may indicate: Feminization syndromes in males. Very early onset of puberty. A tumor of an ovary, testicle, or adrenal gland. A normal pregnancy. Liver damage. Hyperthyroidism. Levels less than the reference ranges may indicate: Failing pregnancy. Turner syndrome. Hypopituitarism. Menopause. Anorexia nervosa. Polycystic ovarian syndrome (PCOS). Poor ovarian function (hypogonadism). Talk with your health care provider about what your results mean. Questions to ask your health care provider Ask your health care provider, or the department that is doing the test: When will my results be ready? What are my treatment options? How will I get my results? What other tests do I need? What are my next steps? Summary The estrogen fractions test measures the level of estrogen in the body. The test is used to evaluate sexual maturity, status of menopause, menstrual problems, or fertility problems in females. This test measures the levels of the three major  forms of estrogen. The combination of two of the major forms, estrone and estradiol, is called total estrogen level. The estrogen fractions test may be done using a blood sample or a 24-hour urine collection sample. Talk with your health care provider about what your results mean. This information is not intended to replace advice given to you by your health care provider. Make sure you discuss any questions you have with your health care provider. Document Revised: 10/04/2019 Document Reviewed: 10/04/2019 Elsevier Patient Education  2022 [employer].

## 2021-06-24 NOTE — Addendum Note (Signed)
Addended by: Nelda Bucks on: 06/24/2021 04:51 PM   Modules accepted: Orders

## 2021-06-24 NOTE — Progress Notes (Signed)
I,Tianna Badgett,acting as a Neurosurgeon for [employer], FNP.,have documented all relevant documentation on the behalf of Arnette Felts, FNP,as directed by  Arnette Felts, FNP while in the presence of Arnette Felts, FNP.  This visit occurred during the SARS-CoV-2 public health emergency.  Safety protocols were in place, including screening questions prior to the visit, additional usage of staff PPE, and extensive cleaning of exam room while observing appropriate contact time as indicated for disinfecting solutions.  Subjective:     Patient ID: [name] [name] , female    DOB: 1970-10-21 , 51 y.o.   MRN: [id number]   Chief Complaint  Patient presents with   Depression    HPI  Patient is here for follow up. She had a hysterectomy in 2009 and still has her ovaries. She is unable to see Dr. Lafayette Dragon due to controlled substances through her insurance. She had TMS symptoms  She is seeing a teledoc for psychiatry tomorrow. She would like to do alternative therapy for her depression/mood. Does not want to be on long term medication.   Wt Readings from Last 3 Encounters: 06/24/21 : (!) 312 lb 6.4 oz (141.7 kg) 06/05/21 : (!) 322 lb (146.1 kg) 03/13/21 : (!) 321 lb 6.4 oz (145.8 kg)      Past Medical History:  Diagnosis Date   Acute pulmonary edema (HCC) 07/01/2003   Allergic rhinitis    Chronic respiratory failure (HCC) 07/01/2003   Iron deficiency anemia    d/t menometrorrhagia   Morbid obesity (HCC)    bariatric surgery   OSA (obstructive sleep apnea) 04/21/2003     Family History  Problem Relation Age of Onset   Asthma Father    Stomach cancer Neg Hx    Colon cancer Neg Hx    Esophageal cancer Neg Hx    Pancreatic cancer Neg Hx     No current outpatient medications on file.   Allergies  Allergen Reactions   Amoxicillin Other (See Comments)     Review of Systems  Constitutional:  Positive for fatigue and unexpected weight change.       Night sweats, hot flashes  Respiratory:  Negative.    Cardiovascular:  Negative for chest pain, palpitations and leg swelling.       Heart racing when she lays down will do her breathing and calm down will work through it. None in last 2 weeks  Gastrointestinal: Negative.   Neurological: Negative.   Psychiatric/Behavioral: Negative.  The patient is not nervous/anxious.     Today's Vitals   06/24/21 1558  BP: 122/68  Pulse: 94  Temp: 98.2 F (36.8 C)  TempSrc: Oral  Weight: (!) 312 lb 6.4 oz (141.7 kg)  Height: 5\' 5"  (1.651 m)   Body mass index is 51.99 kg/m.  Wt Readings from Last 3 Encounters:  06/24/21 (!) 312 lb 6.4 oz (141.7 kg)  06/05/21 (!) 322 lb (146.1 kg)  03/13/21 (!) 321 lb 6.4 oz (145.8 kg)    Objective:  Physical Exam Vitals reviewed.  Constitutional:      General: She is not in acute distress.    Appearance: Normal appearance. She is well-developed. She is obese.  Cardiovascular:     Rate and Rhythm: Normal rate and regular rhythm.     Pulses: Normal pulses.     Heart sounds: Normal heart sounds. No murmur heard. Pulmonary:     Effort: Pulmonary effort is normal. No respiratory distress.     Breath sounds: Normal breath sounds. No wheezing.  Chest:     Chest wall: No tenderness.  Skin:    General: Skin is warm and dry.     Capillary Refill: Capillary refill takes less than 2 seconds.  Neurological:     General: No focal deficit present.     Mental Status: She is alert and oriented to person, place, and time.     Cranial Nerves: No cranial nerve deficit.     Motor: No weakness.  Psychiatric:        Mood and Affect: Mood normal.        Behavior: Behavior normal.        Thought Content: Thought content normal.        Judgment: Judgment normal.        Assessment And Plan:     1. Other fatigue Comments: Will check for metabolic cause.  - Estradiol - TSH - Vitamin B12  2. Concentration deficit Comments: Will check to see if related to her hormones.  - Estradiol  3. Class 3 severe  obesity due to excess calories with serious comorbidity and body mass index (BMI) of 50.0 to 59.9 in adult [facility])  She is encouraged to strive for BMI less than 30 to decrease cardiac risk. Advised to aim for at least 150 minutes of exercise per week. Congratulated on her 10 lb weight loss  4. Encounter for hepatitis C screening test for low risk patient Will check Hepatitis C screening due to recent recommendations to screen all adults 18 years and older - Hepatitis C antibody     Patient was given opportunity to ask questions. Patient verbalized understanding of the plan and was able to repeat key elements of the plan. All questions were answered to their satisfaction.  Arnette Felts, FNP   I, Arnette Felts, FNP, have reviewed all documentation for this visit. The documentation on 06/24/21 for the exam, diagnosis, procedures, and orders are all accurate and complete.   IF YOU HAVE BEEN REFERRED TO A SPECIALIST, IT MAY TAKE 1-2 WEEKS TO SCHEDULE/PROCESS THE REFERRAL. IF YOU HAVE NOT HEARD FROM US/SPECIALIST IN TWO WEEKS, PLEASE GIVE [place] A CALL AT [phone] X [phone].   THE PATIENT IS ENCOURAGED TO PRACTICE SOCIAL DISTANCING DUE TO THE COVID-19 PANDEMIC.

## 2021-06-25 NOTE — Telephone Encounter (Signed)
Called the patient. No answer. Left message to return my call.

## 2021-06-29 NOTE — Telephone Encounter (Signed)
I have mailed a letter to the patient.

## 2021-06-30 LAB — TSH: TSH: 2.53 mIU/L

## 2021-06-30 LAB — VITAMIN B12: Vitamin B-12: 216 pg/mL (ref 200–1100)

## 2021-06-30 LAB — ESTRADIOL, FREE

## 2021-06-30 LAB — HEPATITIS C ANTIBODY
Hepatitis C Ab: NONREACTIVE
SIGNAL TO CUT-OFF: 0.02 (ref <1.00)

## 2021-06-30 LAB — EXTRA LAV TOP TUBE

## 2021-07-07 ENCOUNTER — Encounter: Payer: Self-pay | Admitting: Nurse Practitioner

## 2021-07-13 NOTE — Telephone Encounter (Signed)
The patient is not responding to the attempts to reach her. ?

## 2021-07-13 NOTE — Telephone Encounter (Signed)
Agree with sending letter. ?Dr. Sherian Rein ?

## 2021-07-21 ENCOUNTER — Encounter: Payer: Self-pay | Admitting: Nurse Practitioner

## 2021-07-22 ENCOUNTER — Other Ambulatory Visit: Payer: Self-pay

## 2021-07-29 ENCOUNTER — Telehealth: Payer: Self-pay

## 2021-07-29 NOTE — Telephone Encounter (Signed)
Left pt vm to give the office a call back. B12c injections needing to be scheduled, or pt may come in every Thursday for Lipo B with an out of pocket cost of $25.   ?

## 2021-08-18 ENCOUNTER — Ambulatory Visit (INDEPENDENT_AMBULATORY_CARE_PROVIDER_SITE_OTHER): Payer: BC Managed Care – PPO

## 2021-08-18 VITALS — BP 122/68 | HR 70 | Temp 98.3°F | Ht 65.0 in | Wt 312.0 lb

## 2021-08-18 DIAGNOSIS — E538 Deficiency of other specified B group vitamins: Secondary | ICD-10-CM

## 2021-08-18 MED ORDER — CYANOCOBALAMIN 1000 MCG/ML IJ SOLN
1000.0000 ug | Freq: Once | INTRAMUSCULAR | Status: AC
  Administered 2021-08-18: 1000 ug via INTRAMUSCULAR

## 2021-08-18 NOTE — Progress Notes (Signed)
Patient present for 1st b12 injection.  ?

## 2021-08-25 ENCOUNTER — Ambulatory Visit: Payer: BC Managed Care – PPO

## 2021-09-01 ENCOUNTER — Ambulatory Visit (INDEPENDENT_AMBULATORY_CARE_PROVIDER_SITE_OTHER): Payer: BC Managed Care – PPO

## 2021-09-01 DIAGNOSIS — E538 Deficiency of other specified B group vitamins: Secondary | ICD-10-CM

## 2021-09-01 MED ORDER — CYANOCOBALAMIN 1000 MCG/ML IJ SOLN
1000.0000 ug | Freq: Once | INTRAMUSCULAR | Status: AC
  Administered 2021-09-01: 1000 ug via INTRAMUSCULAR

## 2021-09-01 NOTE — Progress Notes (Signed)
Pt presents for 2nd b12. She missed last appointment.  ?

## 2021-09-08 ENCOUNTER — Ambulatory Visit (INDEPENDENT_AMBULATORY_CARE_PROVIDER_SITE_OTHER): Payer: BC Managed Care – PPO

## 2021-09-08 DIAGNOSIS — E538 Deficiency of other specified B group vitamins: Secondary | ICD-10-CM

## 2021-09-08 MED ORDER — CYANOCOBALAMIN 1000 MCG/ML IJ SOLN
1000.0000 ug | Freq: Once | INTRAMUSCULAR | Status: AC
  Administered 2021-09-08: 1000 ug via INTRAMUSCULAR

## 2021-09-08 NOTE — Progress Notes (Signed)
Patient presents today for 3rd b12 injection. Pt refused all vitals.  ?

## 2021-09-15 ENCOUNTER — Ambulatory Visit (INDEPENDENT_AMBULATORY_CARE_PROVIDER_SITE_OTHER): Payer: BC Managed Care – PPO

## 2021-09-15 DIAGNOSIS — E538 Deficiency of other specified B group vitamins: Secondary | ICD-10-CM

## 2021-09-15 MED ORDER — CYANOCOBALAMIN 1000 MCG/ML IJ SOLN
1000.0000 ug | Freq: Once | INTRAMUSCULAR | Status: AC
  Administered 2021-09-15: 1000 ug via INTRAMUSCULAR

## 2021-09-15 NOTE — Progress Notes (Signed)
Patient presents today for 4th b12 injection.  

## 2021-10-13 IMAGING — MG MM DIGITAL SCREENING BILAT W/ TOMO AND CAD
6 of 10 series · 6 of 30 positions shown · non-contrast
Comparison: Previous exam(s).

ACR Breast Density Category a: The breast tissue is almost entirely
fatty.

CLINICAL DATA: Screening.

EXAM:
DIGITAL SCREENING BILATERAL MAMMOGRAM WITH TOMOSYNTHESIS AND CAD
TECHNIQUE: Bilateral screening digital craniocaudal and mediolateral oblique
mammograms were obtained. Bilateral screening digital breast
tomosynthesis was performed. The images were evaluated with
computer-aided detection.

[R CC synth-2D]
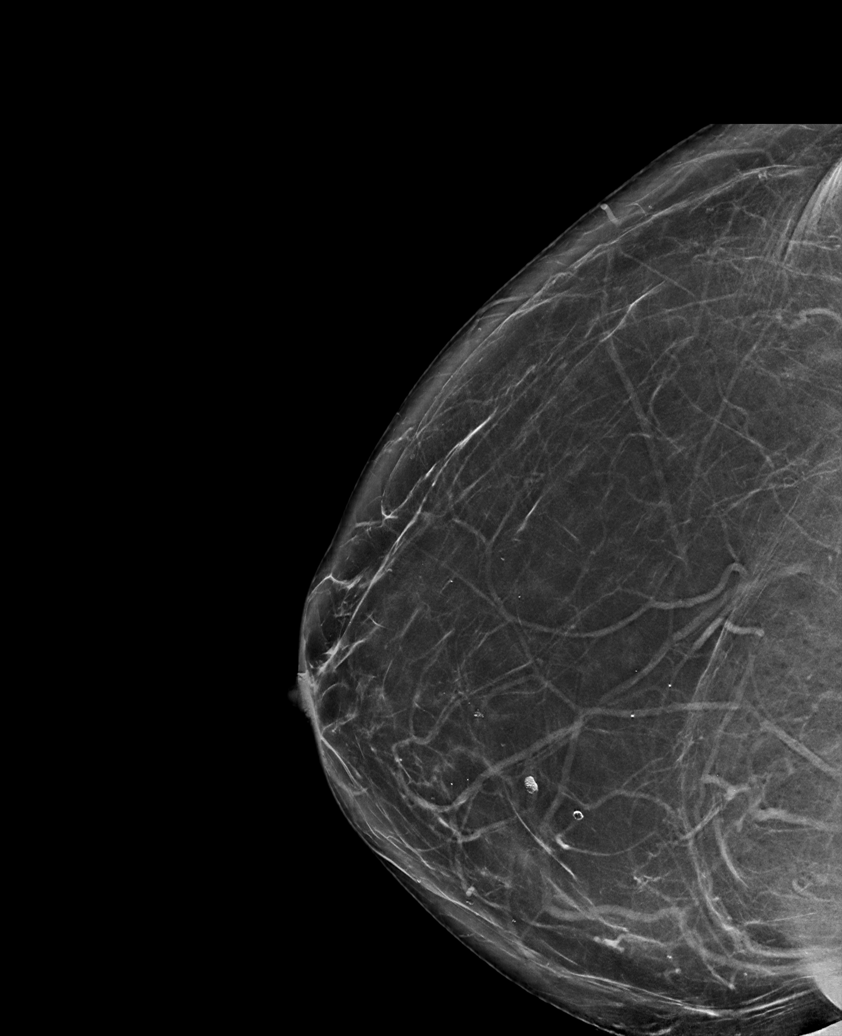

[R MLO synth-2D]
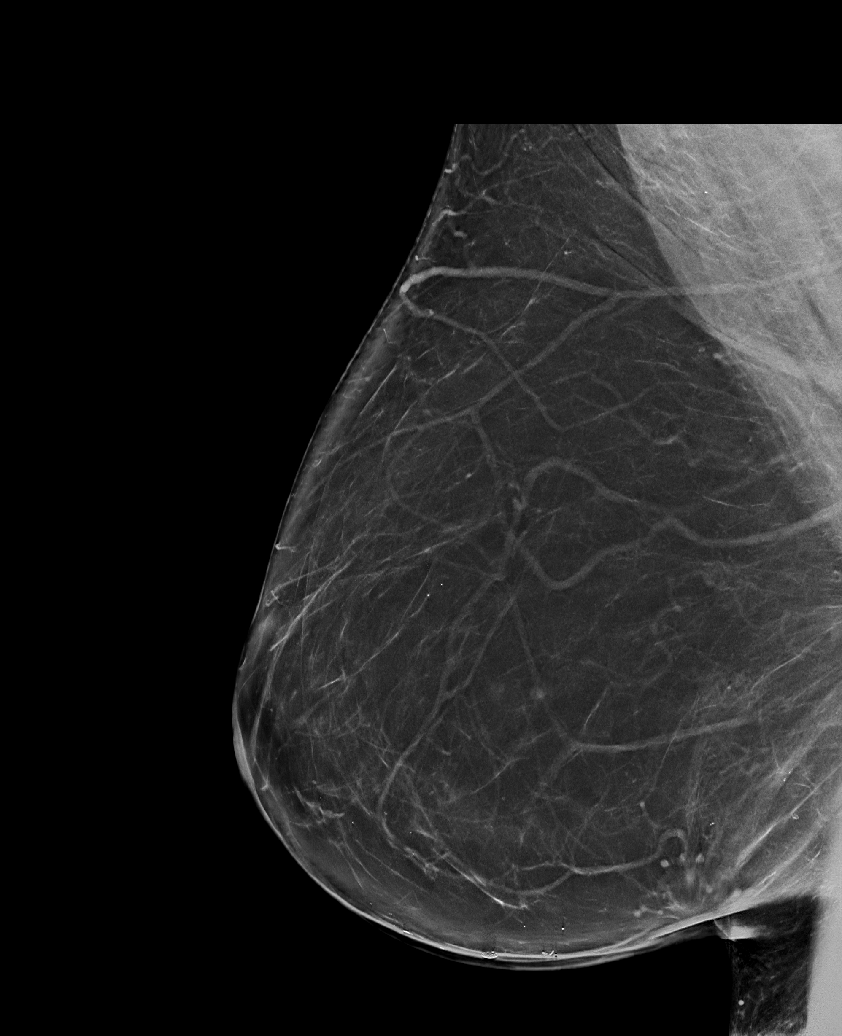

[L CC synth-2D]
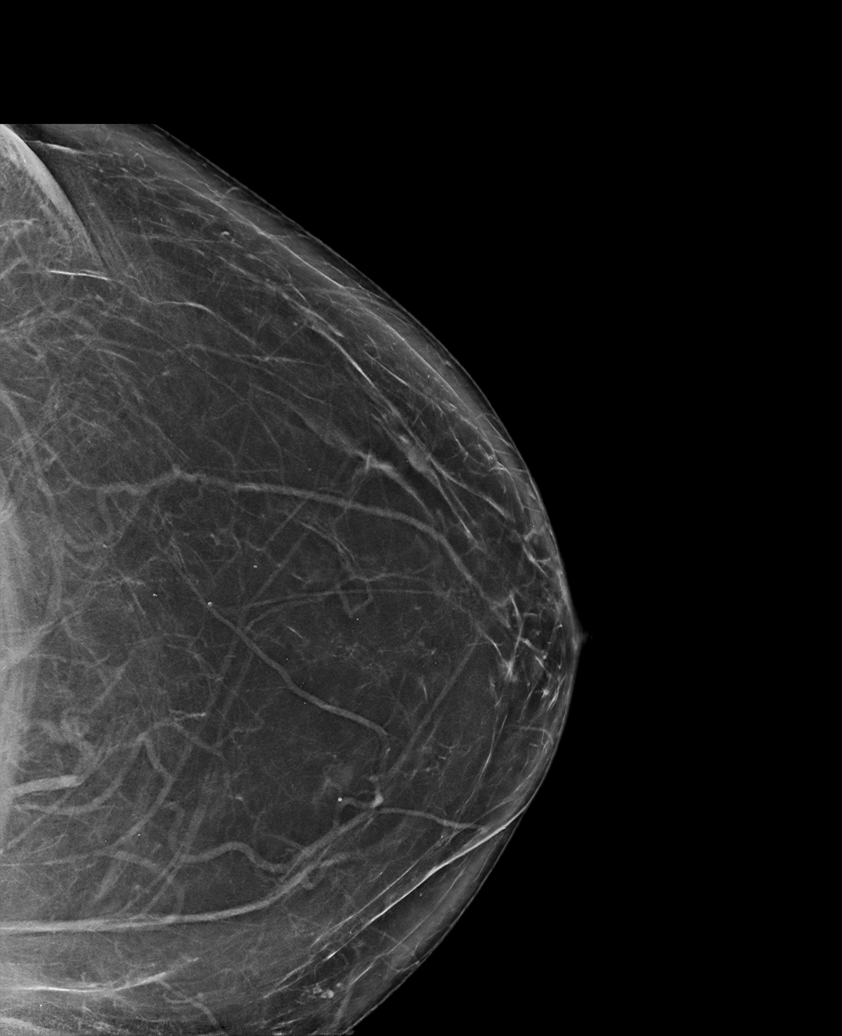

[L MLO synth-2D]
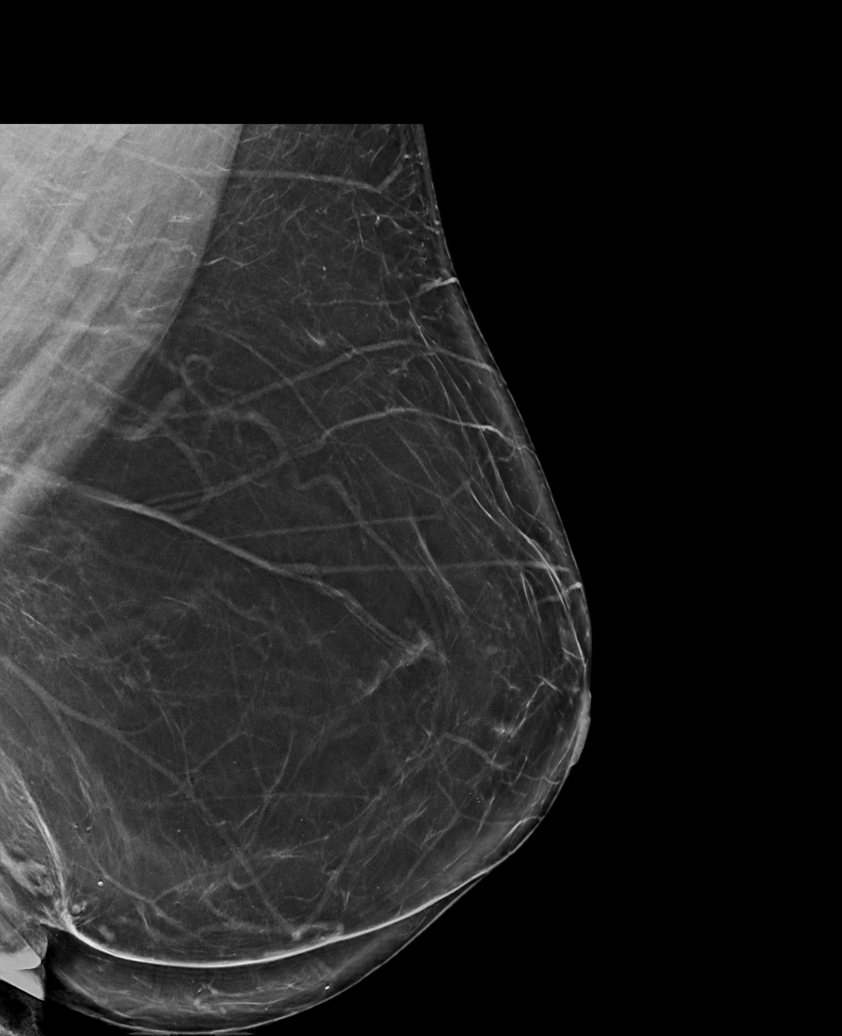

[R CV synth-2D]
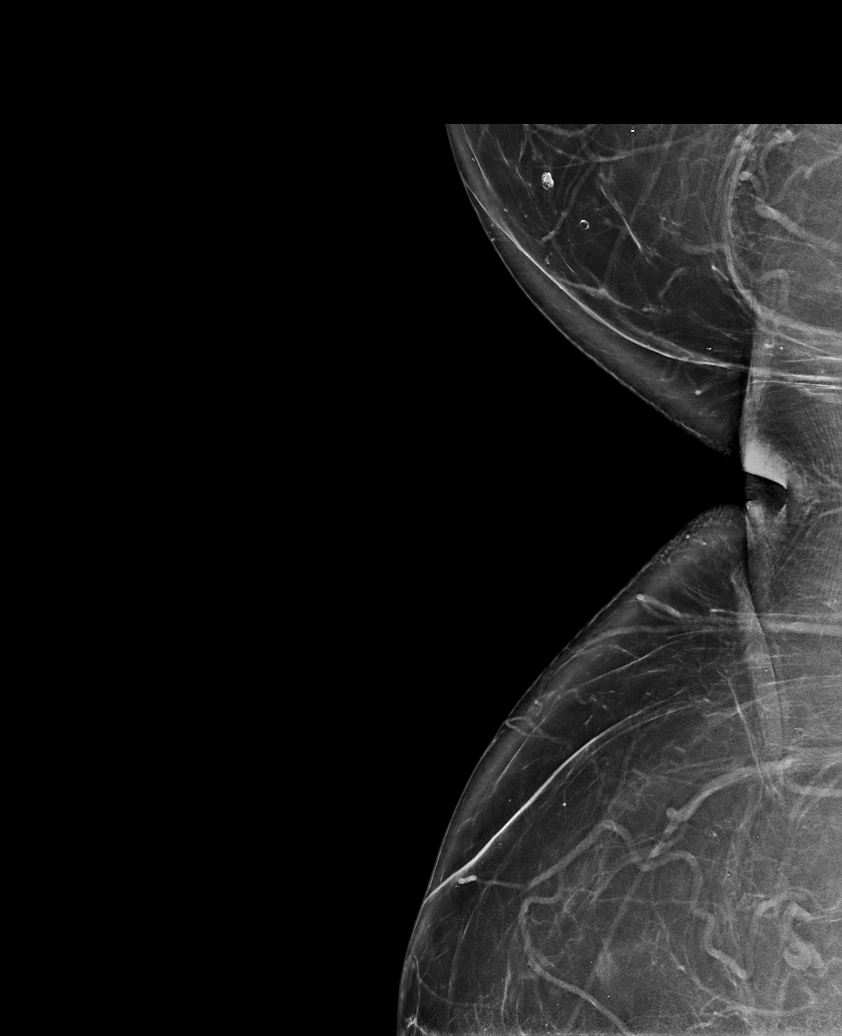

[R CC tomo · tomo slice 43/85.0]
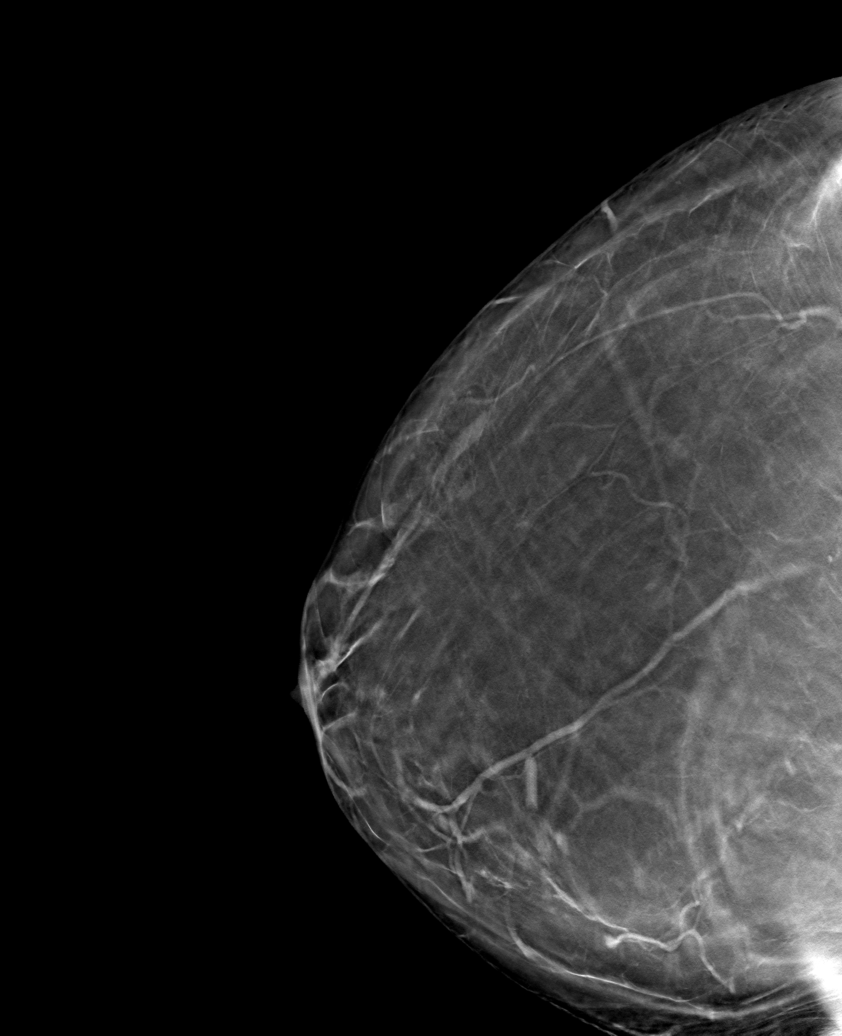

[6 of 30 positions shown; findings below may reference images not displayed]

FINDINGS: There are no findings suspicious for malignancy.
IMPRESSION: No mammographic evidence of malignancy. A result letter of this
screening mammogram will be mailed directly to the patient.

RECOMMENDATION:
Screening mammogram in one year. (Code:0E-3-N98)

BI-RADS CATEGORY  1: Negative.

## 2022-01-12 ENCOUNTER — Ambulatory Visit (INDEPENDENT_AMBULATORY_CARE_PROVIDER_SITE_OTHER): Payer: BC Managed Care – PPO | Admitting: Podiatry

## 2022-01-12 DIAGNOSIS — B351 Tinea unguium: Secondary | ICD-10-CM

## 2022-01-12 MED ORDER — TERBINAFINE HCL 250 MG PO TABS: 250.0000 mg | ORAL_TABLET | Freq: Every day | ORAL | 0 refills | Status: DC

## 2022-01-12 NOTE — Progress Notes (Signed)
  Subjective:  Patient ID: [name] [name], female    DOB: 1970-11-23,  MRN: [id number]  Chief Complaint  Patient presents with   Nail Problem    left foot toenail fungus to 2nd toe- couple weeks. Has tried OTC topical medication.     51 y.o. female presents with the above complaint. History confirmed with patient.  She previously took a 9-day course of Lamisil when this happened a few years ago and this was successful in eradicating it  Objective:  Physical Exam: warm, good capillary refill, no trophic changes or ulcerative lesions, normal DP and PT pulses, normal sensory exam, and brown-yellow discoloration of the left second toenail.  Assessment:   1. Onychomycosis      Plan:  Patient was evaluated and treated and all questions answered.  Discussed etiology and treatment options of onychomycosis.  She has previously had success with oral therapy with Lamisil.  Rx for 90-day course sent to pharmacy.  She will return for follow-up as needed if this does not improve.  Previously she did not have any side effects from Lamisil use.  She has no further contraindications currently.  Her last LFTs 6 months ago were within normal limits, she does not drink alcohol  Return if symptoms worsen or fail to improve.

## 2022-01-14 ENCOUNTER — Encounter: Payer: Self-pay | Admitting: Internal Medicine

## 2022-01-26 ENCOUNTER — Encounter: Payer: PRIVATE HEALTH INSURANCE | Admitting: Nurse Practitioner

## 2022-03-13 NOTE — Progress Notes (Deleted)
Patient ID: [name] [name], female    DOB: 02-06-1971, 51 y.o.   MRN: [id number]  HPI female never smoker followed for OSA, asthma, obesity/hypoventilation (previous gastric bypass bariatric surgery), complicated by morbid obesity NPSG 04/21/03  AHI 101/ hr, CPAP to 18, weight was 400 lbs HST 12/11/18- AHI 6.3/ hr, desaturation to 80%, body weight 301 lbs --------------------------------------------------------------------------------------------------    03/13/21- 51 year old female never smoker followed for OSA/ noncompliant w CPAP, asthma, obesity/hypoventilation (previous gastric bypass bariatric surgery), complicated by morbid obesity, BIPolar, Covid Pneumonia 2021,  CPAP auto 5-15 Download- not wearing Body weight today-321 lbs Covid vax-3 Phizer Flu vax-had Had stopped using CPAP because she says she was "scared by infomercials about CPAP causing cancer", referring to the [place] recall, 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 She says she is ok with restarting CPAP. Discussed goals and purpose. Never wheezes now and never uses her inhaler.  No acute events. Discussed vaccinations, explaining that with her Pneumovax-23 hx and her age, she shouldn't need repeat pneumococcal vaccination for several years, unless something changes. CXR 06/28/19-  IMPRESSION: No acute abnormalities.  03/15/22-  51 year old female never smoker followed for OSA/ noncompliant w CPAP, asthma, obesity/hypoventilation (previous gastric bypass bariatric surgery), complicated by morbid obesity, BIPolar, Covid Pneumonia 2021,  CPAP auto 5-15 Download- not wearing Body weight today- Covid vax-3 Phizer Flu vax-had    Review of Systems + = positive Constitutional:   No-   weight loss, night sweats, fevers, chills, fatigue, lassitude. HEENT:   No-  headaches, difficulty swallowing, tooth/dental problems, sore throat,       Occasional  sneezing, itching, ear ache, nasal congestion, post nasal drip,  CV:  No-    chest pain, orthopnea, PND, swelling in lower extremities, anasarca, dizziness, palpitations Resp: No- acute  shortness of breath with exertion or at rest.              No-   productive cough,  +non-productive cough,  No- coughing up of blood.              No-   change in color of mucus.  No- wheezing.   Skin: No-   rash or lesions. GI:  No-   heartburn, indigestion, abdominal pain, nausea, vomiting,  GU:  MS:  No-   joint pain or swelling.   Neuro-     nothing unusual Psych:  No- change in mood or affect. No depression or anxiety.  No memory loss.    Objective:   Physical Exam General- Alert, Oriented, Affect-appropriate, Distress- none acute, + Morbidly obese Skin- rash-none, lesions- none, excoriation- none Lymphadenopathy- none Head- atraumatic            Eyes- Gross vision intact, PERRLA, conjunctivae clear secretions            Ears- Hearing, canals normal            Nose- Clear, No- Septal dev, mucus, polyps, erosion, perforation             Throat- Mallampati II-III , mucosa clear , drainage- none, tonsils- atrophic Neck- flexible , trachea midline, no stridor , thyroid nl, carotid no bruit Chest - symmetrical excursion , unlabored           Heart/CV- RRR , no murmur , no gallop  , no rub, nl s1 s2                           - JVD-  none , edema- none, stasis changes- none, varices- none           Lung- clear to P&A, wheeze- none,  dullness-none, rub- none                Chest wall-  Abd- Br/ Gen/ Rectal- Not done, not indicated Extrem- cyanosis- none, clubbing, none, atrophy- none, strength- nl Neuro- grossly intact to observation

## 2022-03-15 ENCOUNTER — Ambulatory Visit: Payer: BC Managed Care – PPO | Admitting: Internal Medicine

## 2022-04-02 ENCOUNTER — Emergency Department (HOSPITAL_COMMUNITY): Payer: BC Managed Care – PPO

## 2022-04-02 ENCOUNTER — Emergency Department (HOSPITAL_COMMUNITY)
Admission: EM | Admit: 2022-04-02 | Discharge: 2022-04-02 | Disposition: A | Discharge status: Home / Self Care | Payer: BC Managed Care – PPO | Attending: Emergency Medicine | Admitting: Emergency Medicine

## 2022-04-02 ENCOUNTER — Encounter (HOSPITAL_COMMUNITY): Payer: Self-pay

## 2022-04-02 ENCOUNTER — Other Ambulatory Visit: Payer: Self-pay

## 2022-04-02 ENCOUNTER — Encounter: Payer: Self-pay | Admitting: Internal Medicine

## 2022-04-02 DIAGNOSIS — J101 Influenza due to other identified influenza virus with other respiratory manifestations: Secondary | ICD-10-CM | POA: Diagnosis not present

## 2022-04-02 DIAGNOSIS — G4733 Obstructive sleep apnea (adult) (pediatric): Secondary | ICD-10-CM | POA: Insufficient documentation

## 2022-04-02 DIAGNOSIS — R059 Cough, unspecified: Secondary | ICD-10-CM | POA: Diagnosis present

## 2022-04-02 HISTORY — DX: Pneumonia, unspecified organism: J18.9

## 2022-04-02 LAB — CBC WITH DIFFERENTIAL/PLATELET
Abs Immature Granulocytes: 0.02 10*3/uL (ref 0.00–0.07)
Basophils Absolute: 0 10*3/uL (ref 0.0–0.1)
Basophils Relative: 1 %
Eosinophils Absolute: 0.1 10*3/uL (ref 0.0–0.5)
Eosinophils Relative: 1 %
HCT: 37.7 % (ref 36.0–46.0)
Hemoglobin: 11.9 g/dL — ABNORMAL LOW (ref 12.0–15.0)
Immature Granulocytes: 0 %
Lymphocytes Relative: 7 %
Lymphs Abs: 0.4 10*3/uL — ABNORMAL LOW (ref 0.7–4.0)
MCH: 29.8 pg (ref 26.0–34.0)
MCHC: 31.6 g/dL (ref 30.0–36.0)
MCV: 94.3 fL (ref 80.0–100.0)
Monocytes Absolute: 0.7 10*3/uL (ref 0.1–1.0)
Monocytes Relative: 11 %
Neutro Abs: 4.8 10*3/uL (ref 1.7–7.7)
Neutrophils Relative %: 80 %
Platelets: 276 10*3/uL (ref 150–400)
RBC: 4 MIL/uL (ref 3.87–5.11)
RDW: 13.3 % (ref 11.5–15.5)
WBC: 6 10*3/uL (ref 4.0–10.5)
nRBC: 0 % (ref 0.0–0.2)

## 2022-04-02 LAB — BASIC METABOLIC PANEL
Anion gap: 6 (ref 5–15)
BUN: 10 mg/dL (ref 6–20)
CO2: 28 mmol/L (ref 22–32)
Calcium: 8.8 mg/dL — ABNORMAL LOW (ref 8.9–10.3)
Chloride: 105 mmol/L (ref 98–111)
Creatinine, Ser: 0.76 mg/dL (ref 0.44–1.00)
GFR, Estimated: >60 mL/min (ref >60)
Glucose, Bld: 108 mg/dL — ABNORMAL HIGH (ref 70–99)
Potassium: 3.2 mmol/L — ABNORMAL LOW (ref 3.5–5.1)
Sodium: 139 mmol/L (ref 135–145)

## 2022-04-02 MED ORDER — POTASSIUM CHLORIDE CRYS ER 20 MEQ PO TBCR
40.0000 meq | EXTENDED_RELEASE_TABLET | Freq: Once | ORAL | Status: AC
  Administered 2022-04-02: 40 meq via ORAL
  Filled 2022-04-02: qty 2

## 2022-04-02 NOTE — ED Provider Triage Note (Addendum)
Emergency Medicine Provider Triage Evaluation Note  [name] [name] , a 50 y.o. female  was evaluated in triage.  Pt complains of worsening URI symptoms over the last 2 days.  Wednesday began developing a small cough.  Yesterday developed body aches and mild shortness of breath.  Today felt short of breath and went to UC for evaluation.  Tested positive for influenza A.  Was recommended to be further evaluated the ED due to abnormal vital signs and abnormal lung sounds.  Hx of pneumonia 2021 that required hospitalization.  Has taken Allegra.  Denies chest pain, chills, fevers, or diarrhea.  No Hx of asthma or lung disease.  Review of Systems  Positive:  Negative: See above  Physical Exam  BP (!) 150/91 (BP Location: Left Arm)   Pulse (!) 103   Temp 100 F (37.8 C) (Oral)   Resp 20   Ht 5\' 5"  (1.651 m)   Wt (!) 140.2 kg   SpO2 92%   BMI 51.42 kg/m  Gen:   Awake, no distress   Resp:  Normal effort, global wheezing with adequate air movement.  Able to communicate with minimal to no difficulty, though with occasional coughing fits upon deep inspiration MSK:   Moves extremities without difficulty  Other:  Nontoxic, nonseptic appearing.  Sitting comfortably.  Abdomen soft, non-TTP.  Medical Decision Making  Medically screening exam initiated at 12:13 PM.  Appropriate orders placed.  [name] [name] was informed that the remainder of the evaluation will be completed by another provider, this initial triage assessment does not replace that evaluation, and the importance of remaining in the ED until their evaluation is complete.  94-96% on room air during exam.      Ruben Gottron, PA-C 04/02/22 1222

## 2022-04-02 NOTE — ED Provider Notes (Signed)
[facility]-EMERGENCY DEPT Provider Note   CSN: [id number] Arrival date & time: 04/02/22  1110     History  No chief complaint on file.   [name] [name] is a 51 y.o. female.  51 yo female with history of OSA, acute pulmonary edema, chronic respiratory failure and pneumonia presents with concern for cough, congestion and body aches with fever.  Patient states her symptoms started 2 days ago.  [facility] and tested positive for influenza A.  Patient was sent to the emergency room due to O2 sat of 92%.  Patient denies significant shortness of breath, reports compliance with home CPAP.  States that she has been doing a home exercise program and swimming regularly to lose weight and has been successful.       Home Medications Prior to Admission medications   Medication Sig Start Date End Date Taking? Authorizing Provider  terbinafine (LAMISIL) 250 MG tablet Take 1 tablet (250 mg total) by mouth daily. 01/12/22   Edwin Cap, DPM      Allergies    Amoxicillin    Review of Systems   Review of Systems Negative except as per HPI Physical Exam Updated Vital Signs BP (!) 145/88 (BP Location: Left Arm)   Pulse (!) 105   Temp 98.3 F (36.8 C) (Oral)   Resp 19   Ht 5\' 5"  (1.651 m)   Wt (!) 140.2 kg   SpO2 94%   BMI 51.42 kg/m  Physical Exam Vitals and nursing note reviewed.  Constitutional:      General: She is not in acute distress.    Appearance: She is well-developed. She is not diaphoretic.  HENT:     Head: Normocephalic and atraumatic.     Mouth/Throat:     Mouth: Mucous membranes are moist.  Eyes:     Conjunctiva/sclera: Conjunctivae normal.  Cardiovascular:     Rate and Rhythm: Normal rate and regular rhythm.     Pulses: Normal pulses.     Heart sounds: Normal heart sounds.  Pulmonary:     Effort: Pulmonary effort is normal.     Breath sounds: Normal breath sounds.  Musculoskeletal:     Cervical back:  Neck supple.     Right lower leg: Edema present.     Left lower leg: Edema present.     Comments: Trace pitting edema bilateral lower extremities  Skin:    General: Skin is warm and dry.     Coloration: Skin is not pale.     Findings: No erythema or rash.  Neurological:     Mental Status: She is alert and oriented to person, place, and time.  Psychiatric:        Behavior: Behavior normal.     ED Results / Procedures / Treatments   Labs (all labs ordered are listed, but only abnormal results are displayed) Labs Reviewed  BASIC METABOLIC PANEL - Abnormal; Notable for the following components:      Result Value   Potassium 3.2 (*)    Glucose, Bld 108 (*)    Calcium 8.8 (*)    All other components within normal limits  CBC WITH DIFFERENTIAL/PLATELET - Abnormal; Notable for the following components:   Hemoglobin 11.9 (*)    Lymphs Abs 0.4 (*)    All other components within normal limits    EKG None  Radiology DG Chest 2 View  Result Date: 04/02/2022 CLINICAL DATA:  Shortness of breath, cough. EXAM:  CHEST - 2 VIEW COMPARISON:  June 28, 2019. FINDINGS: Mild cardiomegaly is noted with mild central pulmonary vascular congestion. No consolidative process is noted. Bony thorax is unremarkable. IMPRESSION: Mild cardiomegaly with mild central pulmonary vascular congestion. Electronically Signed   By: Lupita Raider M.D.   On: 04/02/2022 12:57    Procedures Procedures    Medications Ordered in ED Medications  potassium chloride SA (KLOR-CON M) CR tablet 40 mEq (40 mEq Oral Given 04/02/22 1342)    ED Course/ Medical Decision Making/ A&P                           Medical Decision Making Risk Prescription drug management.   51 year old female presents after testing positive for influenza A due to concern for O2 sat 92% on room air at urgent care today.  Basic labs were obtained including a CBC and BMP.  White count is normal, is found to have a mild hypokalemia with  potassium of 3.2.  Patient is provided with oral supplement prior to discharge.  Her chest x-ray shows mild cardiomegaly, no evidence of pneumonia.  Agree with radiologist interpretation.  She has trace pitting edema to her lower extremities, notes this to be her baseline and unchanged.  O2 sat during evaluation of patient is monitored and noted to be 96 to 100% on room air.  Discussed results with patient, she feels reassured with today's findings and is relieved that she does not need to be hospitalized at this time.  Patient feels well enough to go home to continue to manage her symptoms at home with return to ER precautions and plan to recheck with her doctor next week.        Final Clinical Impression(s) / ED Diagnoses Final diagnoses:  Influenza A    Rx / DC Orders ED Discharge Orders     None         Jeannie Fend, PA-C 04/02/22 1430    Derwood Kaplan, MD 04/09/22 [phone]

## 2022-04-02 NOTE — ED Triage Notes (Signed)
Patient went to the [facility] today and was positive for influenza A. Patient states she was referred to the ED for abnormal VS and a history of pneumonia that required hospitalization.  Patient's VS at the [facility]  BP 144/90. HR-99, Sats-92% on  room air and T. 100.2   ED VS- BP-150/91, HR-103, T.-100.0, Sats-92% on room air.

## 2022-04-02 NOTE — Discharge Instructions (Signed)
Home to rest. Return to ER for worsening or concerning symptoms. Recheck with your doctor Monday.

## 2022-04-07 ENCOUNTER — Telehealth: Payer: Self-pay

## 2022-04-07 NOTE — Telephone Encounter (Signed)
Transition Care Management Unsuccessful Follow-up Telephone Call  Date of discharge and from where:  04/02/2022 [place]   Attempts:  1st Attempt  Reason for unsuccessful TCM follow-up call:  Left voice message

## 2022-04-10 NOTE — Progress Notes (Signed)
Patient ID: [name] [name], female    DOB: 1971-03-26, 51 y.o.   MRN: [id number]  HPI female never smoker followed for OSA, asthma, obesity/hypoventilation (previous gastric bypass bariatric surgery), complicated by morbid obesity NPSG 04/21/03  AHI 101/ hr, CPAP to 18, weight was 400 lbs HST 12/11/18- AHI 6.3/ hr, desaturation to 80%, body weight 301 lbs --------------------------------------------------------------------------------------------------   03/13/21- 51 year old female never smoker followed for OSA/ noncompliant w CPAP, asthma, obesity/hypoventilation (previous gastric bypass bariatric surgery), complicated by morbid obesity, BIPolar, Covid Pneumonia 2021,  CPAP auto 5-15 Download- not wearing Body weight today-321 lbs Covid vax-3 Phizer Flu vax-had Had stopped using CPAP because she says she was "scared by infomercials about CPAP causing cancer", referring to the [place] recall, 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 She says she is ok with restarting CPAP. Discussed goals and purpose. Never wheezes now and never uses her inhaler.  No acute events. Discussed vaccinations, explaining that with her Pneumovax-23 hx and her age, she shouldn't need repeat pneumococcal vaccination for several years, unless something changes. CXR 06/28/19-  IMPRESSION: No acute abnormalities.  04/13/22-  51 year old female never smoker followed for OSA/ noncompliant w CPAP, asthma, obesity/hypoventilation (previous gastric bypass bariatric surgery), complicated by morbid obesity, BIPolar, Covid Pneumonia 2021,  CPAP auto 5-15 Download- 33%, AHI 6.5/ hr Body weight today-300 lbs Covid vax-4 Phizer Flu vax- had ED 04/02/22- Influenza A She feels she has resolved symptoms of influenza which were treated with over-the-counter meds.  She feels back to baseline with her breathing now. Still struggles with CPAP compliance.  We reviewed this again with discussion of download, goals,  alternatives. Encouraged weight loss. CXR 04/02/22-  IMPRESSION: Mild cardiomegaly with mild central pulmonary vascular congestion.  Review of Systems + = positive Constitutional:   No-   weight loss, night sweats, fevers, chills, fatigue, lassitude. HEENT:   No-  headaches, difficulty swallowing, tooth/dental problems, sore throat,       Occasional  sneezing, itching, ear ache, nasal congestion, post nasal drip,  CV:  No-   chest pain, orthopnea, PND, swelling in lower extremities, anasarca, dizziness, palpitations Resp: No- acute  shortness of breath with exertion or at rest.              No-   productive cough,  +non-productive cough,  No- coughing up of blood.              No-   change in color of mucus.  No- wheezing.   Skin: No-   rash or lesions. GI:  No-   heartburn, indigestion, abdominal pain, nausea, vomiting,  GU:  MS:  No-   joint pain or swelling.   Neuro-     nothing unusual Psych:  No- change in mood or affect. No depression or anxiety.  No memory loss.    Objective:   Physical Exam General- Alert, Oriented, Affect-appropriate, Distress- none acute, + Morbidly obese Skin- rash-none, lesions- none, excoriation- none Lymphadenopathy- none Head- atraumatic            Eyes- Gross vision intact, PERRLA, conjunctivae clear secretions            Ears- Hearing, canals normal            Nose- Clear, No- Septal dev, mucus, polyps, erosion, perforation             Throat- Mallampati II-III , mucosa clear , drainage- none, tonsils- atrophic Neck- flexible , trachea midline, no stridor , thyroid nl, carotid no  bruit Chest - symmetrical excursion , unlabored           Heart/CV- RRR , no murmur , no gallop  , no rub, nl s1 s2                           - JVD- none , edema- none, stasis changes- none, varices- none           Lung- clear to P&A, wheeze- none,  dullness-none, rub- none                Chest wall-  Abd- Br/ Gen/ Rectal- Not done, not indicated Extrem- cyanosis- none,  clubbing, none, atrophy- none, strength- nl Neuro- grossly intact to observation

## 2022-04-13 ENCOUNTER — Other Ambulatory Visit: Payer: Self-pay | Admitting: Podiatry

## 2022-04-13 ENCOUNTER — Encounter: Payer: Self-pay | Admitting: Internal Medicine

## 2022-04-13 ENCOUNTER — Ambulatory Visit (INDEPENDENT_AMBULATORY_CARE_PROVIDER_SITE_OTHER): Payer: BC Managed Care – PPO | Admitting: Internal Medicine

## 2022-04-13 VITALS — BP 118/86 | HR 78 | Ht 65.0 in | Wt 300.8 lb

## 2022-04-13 DIAGNOSIS — G4733 Obstructive sleep apnea (adult) (pediatric): Secondary | ICD-10-CM | POA: Diagnosis not present

## 2022-04-13 NOTE — Patient Instructions (Signed)
Keep on using your CPAP as much as you can- auto 5-15  Please call if we can help

## 2022-05-14 ENCOUNTER — Encounter: Payer: Self-pay | Admitting: Internal Medicine

## 2022-05-14 NOTE — Assessment & Plan Note (Signed)
Bariatric surgery.  Encouraged to continue efforts.  She would need to work with an external program like [facility] [employer] [facility].

## 2022-05-14 NOTE — Assessment & Plan Note (Signed)
Benefits from CPAP when used. Plan-continue auto 5-15.  Education done.

## 2022-05-26 ENCOUNTER — Encounter: Payer: Self-pay | Admitting: Internal Medicine

## 2022-06-25 ENCOUNTER — Encounter: Payer: Self-pay | Admitting: Internal Medicine

## 2022-06-25 DIAGNOSIS — Z1231 Encounter for screening mammogram for malignant neoplasm of breast: Secondary | ICD-10-CM

## 2022-07-22 ENCOUNTER — Encounter: Payer: Self-pay | Admitting: Podiatry

## 2022-07-22 ENCOUNTER — Ambulatory Visit (INDEPENDENT_AMBULATORY_CARE_PROVIDER_SITE_OTHER): Payer: BC Managed Care – PPO | Admitting: Podiatry

## 2022-07-22 DIAGNOSIS — B351 Tinea unguium: Secondary | ICD-10-CM | POA: Diagnosis not present

## 2022-07-22 DIAGNOSIS — M722 Plantar fascial fibromatosis: Secondary | ICD-10-CM | POA: Diagnosis not present

## 2022-07-22 MED ORDER — TERBINAFINE HCL 250 MG PO TABS: 250.0000 mg | ORAL_TABLET | Freq: Every day | ORAL | 0 refills | Status: AC

## 2022-07-22 NOTE — Progress Notes (Signed)
  Subjective:  Patient ID: [name] [name], female    DOB: 1970/12/30,  MRN: [id number]  Chief Complaint  Patient presents with   Nail Problem    est - left foot toenail fungus f/u - wants Rx refill & orthos    52 y.o. female presents with the above complaint. History confirmed with patient.  She returns for follow-up.  She previously took Lamisil.  She is noting improvement.  She would like a refill on this and continue the medicine.  Has had no ill effects from it.  Recently had routine lab work done and renal and hepatic function is normal.  She is also had histories of plantar fasciitis on both feet before.  She has been unsuccessful because molded foot orthoses.  Current orthoses are worn out and she would like a new pair made  Objective:  Physical Exam: warm, good capillary refill, no trophic changes or ulcerative lesions, normal DP and PT pulses, normal sensory exam, and multiple discolored toenails with dystrophy.  Photographs taken.       Assessment:   1. Onychomycosis   2. Plantar fasciitis, bilateral      Plan:  Patient was evaluated and treated and all questions answered.  Onychomycosis improving with Lamisil therapy.  Photographs were taken.  Refill of the 90-day course sent to her pharmacy.  Recent LFTs normal.  Regarding her plantar fasciitis this is responded well to a custom molded foot orthosis.  She would like to continue to utilize this that has been helpful in preventing recurrence.  I inspected her current pair and they are worn down and losing support.  I recommended a new pair to be fitted today.  She would like a pair for athletic shoes as well as dress shoes.  Both pair of orthoses were casted and sent to lab for application.  Return in about 4 months (around 11/21/2022) for painful thick fungal nails.

## 2022-08-12 ENCOUNTER — Telehealth: Payer: Self-pay | Admitting: Podiatry

## 2022-08-12 NOTE — Telephone Encounter (Signed)
Lmom to call to schedule picking up orthotics   Pending insurance

## 2022-08-16 NOTE — Addendum Note (Signed)
Addended byLilian Kapur, Birdia Jaycox R on: 08/16/2022 07:04 AM   Modules accepted: Orders

## 2022-08-20 ENCOUNTER — Ambulatory Visit: Payer: BC Managed Care – PPO | Admitting: Allergy

## 2022-09-16 ENCOUNTER — Ambulatory Visit: Payer: BC Managed Care – PPO | Admitting: Allergy

## 2022-09-24 ENCOUNTER — Telehealth: Payer: Self-pay | Admitting: Podiatry

## 2022-09-24 NOTE — Telephone Encounter (Signed)
Pt came to pick up her orthotics in the [place] office and states she only got one orthotic and it was to be 2 orthotics. Please call pt and let her know when she can get the other orthotic.Marland Kitchen

## 2022-09-29 NOTE — Telephone Encounter (Signed)
Spoke with patient she is going to bring the sealed bag back to [place] , and I am placing another order for patient today

## 2022-10-14 ENCOUNTER — Telehealth: Payer: Self-pay | Admitting: Podiatry

## 2022-10-14 NOTE — Telephone Encounter (Signed)
Pt called checking status of 2nd pair of orthotics that were to have been ordered about 2 weeks ago. Please leave message for pt if no answer

## 2022-10-19 DIAGNOSIS — M722 Plantar fascial fibromatosis: Secondary | ICD-10-CM

## 2022-10-19 NOTE — Telephone Encounter (Signed)
Lmom for pt to call back to schedule picking up orthotics   Balance 245.00

## 2022-10-21 NOTE — Telephone Encounter (Signed)
Lvm to let pt know that orthos are ready.

## 2022-10-29 ENCOUNTER — Ambulatory Visit: Payer: BC Managed Care – PPO | Admitting: Internal Medicine

## 2022-12-16 ENCOUNTER — Other Ambulatory Visit: Payer: Self-pay | Admitting: Podiatry

## 2022-12-17 ENCOUNTER — Encounter: Payer: Self-pay | Admitting: Internal Medicine

## 2022-12-17 ENCOUNTER — Other Ambulatory Visit: Payer: Self-pay

## 2022-12-17 ENCOUNTER — Ambulatory Visit (INDEPENDENT_AMBULATORY_CARE_PROVIDER_SITE_OTHER): Payer: BC Managed Care – PPO | Admitting: Internal Medicine

## 2022-12-17 VITALS — BP 144/96 | HR 91 | Temp 97.8°F | Resp 18 | Ht 65.0 in | Wt 292.8 lb

## 2022-12-17 DIAGNOSIS — H1045 Other chronic allergic conjunctivitis: Secondary | ICD-10-CM

## 2022-12-17 DIAGNOSIS — L2084 Intrinsic (allergic) eczema: Secondary | ICD-10-CM

## 2022-12-17 DIAGNOSIS — J4520 Mild intermittent asthma, uncomplicated: Secondary | ICD-10-CM

## 2022-12-17 DIAGNOSIS — J302 Other seasonal allergic rhinitis: Secondary | ICD-10-CM | POA: Diagnosis not present

## 2022-12-17 DIAGNOSIS — J3089 Other allergic rhinitis: Secondary | ICD-10-CM

## 2022-12-17 MED ORDER — MONTELUKAST SODIUM 10 MG PO TABS: 10.0000 mg | ORAL_TABLET | Freq: Every day | ORAL | 1 refills | Status: DC

## 2022-12-17 MED ORDER — TRIAMCINOLONE ACETONIDE 0.1 % EX OINT: TOPICAL_OINTMENT | CUTANEOUS | 1 refills | Status: DC

## 2022-12-17 MED ORDER — AZELASTINE-FLUTICASONE 137-50 MCG/ACT NA SUSP: 1.0000 | Freq: Two times a day (BID) | NASAL | 5 refills | Status: DC

## 2022-12-17 MED ORDER — HYDROCORTISONE 2.5 % EX CREA: TOPICAL_CREAM | Freq: Two times a day (BID) | CUTANEOUS | 5 refills | Status: DC

## 2022-12-17 MED ORDER — CROMOLYN SODIUM 4 % OP SOLN: 1.0000 [drp] | Freq: Four times a day (QID) | OPHTHALMIC | 12 refills | Status: DC

## 2022-12-17 NOTE — Progress Notes (Signed)
New Patient Note  RE: [name] [name] MRN: [id number] DOB: 08/31/1970 Date of Office Visit: 12/17/2022  Consult requested by: Arnette Felts, FNP Primary care provider: Arnette Felts, FNP  Chief Complaint: Asthma (Patient stated she only uses her inhaler during respiratory illnesses.), Allergy Testing (Environmental: ALL/Food: No Hx), and Eczema  History of Present Illness: I had the pleasure of seeing [name] [name] for initial evaluation at the [facility] on 12/17/2022. She is a 52 y.o. female, who is referred here by Arnette Felts, FNP for the evaluation of rhinitis, asthma, atopic dermatitis  .  History obtained from patient, chart review.  Chronic rhinitis: started 20 years ago, getting worse  Symptoms include: nasal congestion, rhinorrhea, post nasal drainage, sneezing, watery eyes, itchy eyes, and itchy nose  Occurs seasonally-spring  Potential triggers: pollen, cats, dust  Treatments tried: benadryl, flonase, ketoifen eye drops, cetirizine AIT for a few months  Previous allergy testing: yes: years ago,  History of reflux/heartburn: no History of chronic sinusitis or sinus surgery: no Nonallergic triggers:  Denies     Asthma History:  -Diagnosed at age after PNA in 2014 .  -Current symptoms include cough, shortness of breath, and wheezing 0 daytime symptoms in past month, 0 nighttime awakenings in past month Using rescue inhaler during respiratory illness  -Limitations to daily activity: none - 0 ED visits, 0 UC visits and 0 oral steroids in the past year - 2 number of lifetime hospitalizations, 0 number of lifetime intubations.  - Identified Triggers: respiratory illness - Up-to-date with pneumonia,, Covid-19,, and Flu, vaccines. - History of prior pneumonias: 2014 - hospitalized  - History of prior COVID-19 ,RSV, FLU infection: 2021-hospitalized  Rx with steroids, remdesivir, plasma, FLU A - 04/2022 - Smoking exposure: at work second hand exposure   Previous Diagnostics:  - Prior PFTs or spirometry: none  - Most Recent AEC (04/02/22): 100 -Most Recent Chest Imaging: CXR on (04/02/22): Mild cardiomegaly is noted with mild central pulmonary vascular congestion. No consolidative process is noted. Bony thorax is unremarkable. - Today's Asthma Control Test:  25/25 .   Management:  - Previously used therapies: albuterol as needed .  - Current regimen:  - Maintenance: none  - Rescue: Albuterol 2 puffs q4-6 hrs PRN, not using prior to exercise  She has OSA on CPAP and follows with pulmonary   Atopic Dermatitis:  Diagnosed symptoms started as a child, diagnosed in 2009 flares mostly chest . Previous therapies tried tretinoin and metrogel (mostly for acne)  Current regimen:  tretinoin and metrogel, emollients     Reports use of fragrance/dye free products Identified triggers of flares include unknown Sleep un affected Follows with dermatology     Assessment and Plan: [name] is a 52 y.o. female with: Mild intermittent asthma in adult without complication - Plan: Spirometry with Graph  Seasonal and perennial allergic rhinitis - Plan: Allergy Test, Interdermal Allergy Test  Other chronic allergic conjunctivitis of both eyes  Intrinsic atopic dermatitis   Plan: Patient Instructions  Chronic Rhinitis Seasonal and Perennial Allergic: - allergy testing today: Grass, weeds, trees, mold mix 4, dust mite, cat  - Prevention:  - allergen avoidance when possible - consider allergy shots as long term control of your symptoms by teaching your immune system to be more tolerant of your allergy triggers  - Symptom control: - Continue Nasal Steroid Spray: Best results if used daily. - Options include Flonase (fluticasone), Nasocort (triamcinolone), Nasonex (mometasome) 1- 2 sprays in each nostril daily.  -  All can be purchased over-the-counter if not covered by insurance. - Start  Dymista  1 spray in each nostril twice a day as needed for  nasal congestion/itchy nose - Start Singulair (Montelukast) 10mg  nightly.   - Discontinue if nightmares of behavior changes. - Continue Antihistamine: daily or daily as needed.   -Options include Zyrtec (Cetirizine) 10mg , Claritin (Loratadine) 10mg , Allegra (Fexofenadine) 180mg , or Xyzal (Levocetirinze) 5mg  - Can be purchased over-the-counter if not covered by insurance.  Allergic Conjunctivitis:  - Start Allergy Eye drops-Cromolyn-1 drop each eye up to 4 times daily as needed and Rewetting Drops such as Systane,TheraTears, etc  -Avoid eye drops that say red eye relief as they may contain medications that dry out your eyes.  Mild Intermittent  Asthma: well Controlled  - your lung testing today looked good!  PLAN:  - Spacer use reviewed. - Controller Inhaler:  none            ;    - Rinse mouth out after use - Rescue Inhaler: Albuterol (Proair/Ventolin) 2 puffs . Use  every 4-6 hours as needed for chest tightness, wheezing, or coughing.  Can also use 15 minutes prior to exercise if you have symptoms with activity. - Asthma is not controlled if:  - Symptoms are occurring >2 times a week OR  - >2 times a month nighttime awakenings  - You are requiring systemic steroids (prednisone/steroid injections) more than once per year  - Your require hospitalization for your asthma.  - Please call the clinic to schedule a follow up if these symptoms arise  Atopic Dermatitis:  Daily Care For Maintenance (daily and continue even once eczema controlled) - Recommend hypoallergenic hydrating ointment at least twice daily.  This must be done daily for control of flares. (Great options include Vaseline, CeraVe, Aquaphor, Aveeno, Cetaphil, VaniCream, etc) - Recommend avoiding detergents, soaps or lotions with fragrances/dyes, and instead using products which are hypoallergenic, use second rinse cycle when washing clothes -Wear lose breathable clothing, avoid wool -Avoid extremes of humidity - Limit  showers/baths to 5 minutes and use luke warm water instead of hot, pat dry following baths, and apply moisturizer - can use steroid creams as detailed below up to twice weekly for prevention of flares.  For Flares:(add this to maintenance therapy if needed for flares) - Triamcinolone 0.1% to body for moderate flares-apply topically twice daily to red, raised areas of skin, followed by moisturizer - Hydrocortisone 2.5% to face, armpit or groin-apply topically twice daily to red, raised areas of skin, followed by moisturizer  Follow up: 3 months   Thank you so much for letting me partake in your care today.  Don't hesitate to reach out if you have any additional concerns!  Ferol Luz, MD  [facility] [place] [place]  Reducing Pollen Exposure  The American Academy of Allergy, Asthma and Immunology suggests the following steps to reduce your exposure to pollen during allergy seasons.    Do not hang sheets or clothing out to dry; pollen may collect on these items. Do not mow lawns or spend time around freshly cut grass; mowing stirs up pollen. Keep windows closed at night.  Keep car windows closed while driving. Minimize morning activities outdoors, a time when pollen counts are usually at their highest. Stay indoors as much as possible when pollen counts or humidity is high and on windy days when pollen tends to remain in the air longer. Use air conditioning when possible.  Many air conditioners have  filters that trap the pollen spores. Use a HEPA room air filter to remove pollen form the indoor air you breathe.  Control of Dog or Cat Allergen  Avoidance is the best way to manage a dog or cat allergy. If you have a dog or cat and are allergic to dog or cats, consider removing the dog or cat from the home. If you have a dog or cat but don't want to find it a new home, or if your family wants a pet even though someone in the household is allergic, here are some strategies  that may help keep symptoms at bay:  Keep the pet out of your bedroom and restrict it to only a few rooms. Be advised that keeping the dog or cat in only one room will not limit the allergens to that room. Don't pet, hug or kiss the dog or cat; if you do, wash your hands with soap and water. High-efficiency particulate air (HEPA) cleaners run continuously in a bedroom or living room can reduce allergen levels over time. Regular use of a high-efficiency vacuum cleaner or a central vacuum can reduce allergen levels. Giving your dog or cat a bath at least once a week can reduce airborne allergen.  Control of Mold Allergen   Mold and fungi can grow on a variety of surfaces provided certain temperature and moisture conditions exist.  Outdoor molds grow on plants, decaying vegetation and soil.  The major outdoor mold, Alternaria and Cladosporium, are found in very high numbers during hot and dry conditions.  Generally, a late Summer - Fall peak is seen for common outdoor fungal spores.  Rain will temporarily lower outdoor mold spore count, but counts rise rapidly when the rainy period ends.  The most important indoor molds are Aspergillus and Penicillium.  Dark, humid and poorly ventilated basements are ideal sites for mold growth.  The next most common sites of mold growth are the bathroom and the kitchen.  Outdoor (Seasonal) Mold Control   Use air conditioning and keep windows closed Avoid exposure to decaying vegetation. Avoid leaf raking. Avoid grain handling. Consider wearing a face mask if working in moldy areas.    Indoor (Perennial) Mold Control    Maintain humidity below 50%. Clean washable surfaces with 5% bleach solution. Remove sources e.g. contaminated carpets.   DUST MITE AVOIDANCE MEASURES:  There are three main measures that need and can be taken to avoid house dust mites:  Reduce accumulation of dust in general -reduce furniture, clothing, carpeting, books, stuffed  animals, especially in bedroom  Separate yourself from the dust -use pillow and mattress encasements (can be found at stores such as [employer] or online) -avoid direct exposure to air condition flow -use a HEPA filter device, especially in the bedroom; you can also use a HEPA filter vacuum cleaner -wipe dust with a moist towel instead of a dry towel or broom when cleaning  Decrease mites and/or their secretions -wash clothing and linen and stuffed animals at highest temperature possible, at least every 2 weeks -stuffed animals can also be placed in a bag and put in a freezer overnight  Despite the above measures, it is impossible to eliminate dust mites or their allergen completely from your home.  With the above measures the burden of mites in your home can be diminished, with the goal of minimizing your allergic symptoms.  Success will be reached only when implementing and using all means together.   Meds ordered this encounter  Medications   Azelastine-Fluticasone 137-50 MCG/ACT SUSP    Sig: Place 1 spray into the nose 2 (two) times daily.    Dispense:  23 g    Refill:  5   cromolyn (OPTICROM) 4 % ophthalmic solution    Sig: Place 1 drop into both eyes 4 (four) times daily.    Dispense:  10 mL    Refill:  12   montelukast (SINGULAIR) 10 MG tablet    Sig: Take 1 tablet (10 mg total) by mouth at bedtime.    Dispense:  90 tablet    Refill:  1   triamcinolone ointment (KENALOG) 0.1 %    Sig: Apply topically twice daily to BODY as needed for red, sandpaper like rash.  Do not use on face, groin or armpits.    Dispense:  80 g    Refill:  1   hydrocortisone 2.5 % cream    Sig: Apply topically 2 (two) times daily.    Dispense:  30 g    Refill:  5   Lab Orders  No laboratory test(s) ordered today    Other allergy screening: Asthma: yes Rhino conjunctivitis: yes Food allergy: no Medication allergy:  Amoxillicin - yeast infections  Hymenoptera allergy: no Urticaria:  no Eczema:yes History of recurrent infections suggestive of immunodeficency: no  Diagnostics: Spirometry:  Tracings reviewed. Her effort: Good reproducible efforts. FVC: 1.53 L FEV1: 1.27 L, 78% predicted FEV1/FVC ratio: 83% Interpretation: Spirometry consistent with normal pattern.  After 4 puffs of albuterol FEV1 increased by 0 cc and 0%.  FVC increased by 170 cc and 11%.  This is not a significant postbronchodilator response Please see scanned spirometry results for details.  Skin Testing: Environmental allergy panel.  adequate controls  Results interpreted by myself and discussed with patient/family.  Airborne Adult Perc - 12/17/22 0946     Time Antigen Placed 0947    Allergen Manufacturer Waynette Buttery    Location Back    Number of Test 55    Panel 1 Select    2. Control-Histamine 4+    3. Bahia 3+    4. [place] 3+    5. Johnson 3+    6. Kentucky Blue 3+    7. Meadow Fescue 3+    8. Perennial Rye 3+    9. Timothy 3+    10. Ragweed Mix Negative    11. Cocklebur 3+    12. Plantain,  English 3+    13. Baccharis Negative    14. Dog Fennel Negative    15. Russian Thistle Negative    16. Lamb's Quarters 3+    17. Sheep Sorrell Negative    18. Rough Pigweed 3+    19. Marsh Elder, Rough 3+    20. Mugwort, Common Negative    21. Box, Elder 3+    22. Cedar, red Negative    23. Sweet Gum 3+    24. Pecan Pollen Negative    25. Pine Mix Negative    26. Walnut, Black Pollen Negative    27. Red Mulberry 3+    28. Ash Mix 3+    29. Birch Mix 3+    30. Beech American 3+    31. Cottonwood, Guinea-Bissau Negative    32. Hickory, White 3+    33. Maple Mix 3+    34. Oak, Guinea-[place]issau Mix 3+    35. Sycamore Eastern Negative    36. Alternaria Alternata Negative    37. Cladosporium Herbarum Negative    38. Aspergillus  Mix Negative    39. Penicillium Mix Negative    40. Bipolaris Sorokiniana (Helminthosporium) Negative    41. Drechslera Spicifera (Curvularia) Negative    42. Mucor Plumbeus  Negative    43. Fusarium Moniliforme Negative    44. Aureobasidium Pullulans (pullulara) Negative    45. Rhizopus Oryzae Negative    46. Botrytis Cinera Negative    47. Epicoccum Nigrum Negative    48. Phoma Betae Negative    49. Dust Mite Mix 3+    50. Cat Hair 10,000 BAU/ml Negative    51.  Dog Epithelia Negative    52. Mixed Feathers Negative    53. Horse Epithelia Negative    54. Cockroach, German Negative    55. Tobacco Leaf Negative             Intradermal - 12/17/22 1030     Time Antigen Placed 1030    Allergen Manufacturer Greer    Location Arm    Number of Test 9    Intradermal Select    Control Negative    Ragweed Mix 3+    Mold 1 Negative    Mold 2 Negative    Mold 3 Negative    Mold 4 3+    Cat 3+    Dog Negative    Cockroach Negative             Past Medical History: Patient Active Problem List   Diagnosis Date Noted   Hirsutism 05/19/2021   Folliculitis 05/19/2021   Acne vulgaris 05/19/2021   Pneumonia due to COVID-19 virus 06/01/2019   Acute respiratory failure with hypoxia (HCC)    Loss of smell 05/31/2019   Suspected COVID-19 virus infection 05/31/2019   Bipolar disorder, in partial remission, most recent episode mixed (HCC) 05/31/2019   S/P bariatric surgery 01/20/2017   Morbid obesity with BMI of 50.0-59.9, adult (HCC) 01/20/2017   Binge eating 07/31/2015   Panniculitis 06/03/2011   Overeating 05/10/2011   Iron deficiency anemia 05/10/2011   History of hysterectomy for benign disease 02/05/2011   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   OBESITY, MORBID 02/10/2007   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   ANEMIA 02/10/2007   Obstructive sleep apnea 02/10/2007   Seasonal and perennial allergic rhinitis 02/10/2007   Asthma 02/10/2007   Past Medical History:  Diagnosis Date   Acute pulmonary edema (HCC) 07/01/2003   Allergic rhinitis    Chronic respiratory failure (HCC) 07/01/2003   Iron deficiency anemia    d/t  menometrorrhagia   Morbid obesity (HCC)    bariatric surgery   OSA (obstructive sleep apnea) 04/21/2003   Pneumonia    Past Surgical History: Past Surgical History:  Procedure Laterality Date   GASTRIC BYPASS  2009   REDUCTION MAMMAPLASTY     UTERINE FIBROID SURGERY     Medication List:  Current Outpatient Medications  Medication Sig Dispense Refill   albuterol (VENTOLIN HFA) 108 (90 Base) MCG/ACT inhaler Inhale 1-2 puffs into the lungs every 6 (six) hours as needed for wheezing or shortness of breath.     Azelastine-Fluticasone 137-50 MCG/ACT SUSP Place 1 spray into the nose 2 (two) times daily. 23 g 5   clindamycin (CLEOCIN T) 1 % external solution Apply topically.     clindamycin-tretinoin (ZIANA) gel Apply 1 Application topically at bedtime.     cromolyn (OPTICROM) 4 % ophthalmic solution Place 1 drop into both eyes 4 (four) times daily. 10 mL 12   cyanocobalamin (VITAMIN B12) 1000 MCG/ML injection Inject  1,000 mcg into the skin every 30 (thirty) days.     hydrocortisone 2.5 % cream Apply topically 2 (two) times daily. 30 g 5   montelukast (SINGULAIR) 10 MG tablet Take 1 tablet (10 mg total) by mouth at bedtime. 90 tablet 1   Multiple Vitamin (MULTI-VITAMIN) tablet Take 1 tablet by mouth daily.     traZODone (DESYREL) 100 MG tablet      tretinoin (RETIN-A) 0.025 % cream Apply 1 Application topically at bedtime.     triamcinolone ointment (KENALOG) 0.1 % Apply topically twice daily to BODY as needed for red, sandpaper like rash.  Do not use on face, groin or armpits. 80 g 1   venlafaxine XR (EFFEXOR-XR) 37.5 MG 24 hr capsule Take 37.5 mg by mouth daily.     venlafaxine XR (EFFEXOR-XR) 75 MG 24 hr capsule Take 75 mg by mouth daily with breakfast.     No current facility-administered medications for this visit.   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 No children.   Social Determinants of Health   Financial Resource Strain: Not on file  Food Insecurity: Not on file  Transportation Needs: Not on file  Physical Activity: Not on file  Stress: Not on file  Social Connections: Not on file   Lives in a single-family home that is 54 years old.  No roaches in the house and bed is 91-year-old floor.  No dust mite precautions.  She is exposed to fumes, chemicals and dust at her job.  There is a HEPA filter in the home and home is not near an interstate industrial area. Smoking: No exposure Occupation: Works in a new Garment/textile technologist HistorySurveyor, minerals in the house: no Engineer, civil (consulting) in the family room: yes Carpet in the bedroom: yes Heating: electric Cooling: central Pet: no  Family History: Family History  Problem Relation Age of Onset   Asthma Father    Stomach cancer Neg Hx    Colon cancer Neg Hx    Esophageal cancer Neg Hx    Pancreatic cancer Neg Hx      ROS: All others negative except as noted per HPI.   Objective: BP (!) 144/96 (BP Location: Right Arm, Patient Position: Sitting, Cuff Size: Large) Comment: Patient stated she took BP meds around 8:30 am  Pulse 91   Temp 97.8 F (36.6 C) (Temporal)   Resp 18   Ht 5\' 5"  (1.651 m)   Wt 292 lb 12.8 oz (132.8 kg)   SpO2 96%   BMI 48.72 kg/m  Body mass index  is 48.72 kg/m.  General Appearance:  Alert, cooperative, no distress, appears stated  age  Head:  Normocephalic, without obvious abnormality, atraumatic  Eyes:  Conjunctiva clear, EOM's intact  Nose: Nares normal,  erythematous nasal  mucosa , no visible anterior polyps, and septum midline  Throat: Lips, tongue normal; teeth and gums normal,  partial dentures  and normal posterior oropharynx  Neck: Supple, symmetrical  Lungs:   clear to auscultation bilaterally, Respirations unlabored, no coughing  Heart:  regular rate and rhythm and no murmur, Appears well perfused  Extremities: No edema  Skin: Skin color, texture, turgor normal, no rashes or lesions on visualized portions of skin  Neurologic: No gross deficits   The plan was reviewed with the patient/family, and all questions/concerned were addressed.  It was my pleasure to see [name] [name] today and participate in her care. Please feel free to contact me with any questions or concerns.  Sincerely,  Ferol Luz, MD Allergy & Immunology  [facility] office: [phone] [facility] office: [phone]

## 2022-12-17 NOTE — Patient Instructions (Addendum)
Chronic Rhinitis Seasonal and Perennial Allergic: - allergy testing today: Grass, weeds, trees, mold mix 4, dust mite, cat  - Prevention:  - allergen avoidance when possible - consider allergy shots as long term control of your symptoms by teaching your immune system to be more tolerant of your allergy triggers  - Symptom control: - Continue Nasal Steroid Spray: Best results if used daily. - Options include Flonase (fluticasone), Nasocort (triamcinolone), Nasonex (mometasome) 1- 2 sprays in each nostril daily.  - All can be purchased over-the-counter if not covered by insurance. - Start  Dymista  1 spray in each nostril twice a day as needed for nasal congestion/itchy nose - Start Singulair (Montelukast) 10mg  nightly.   - Discontinue if nightmares of behavior changes. - Continue Antihistamine: daily or daily as needed.   -Options include Zyrtec (Cetirizine) 10mg , Claritin (Loratadine) 10mg , Allegra (Fexofenadine) 180mg , or Xyzal (Levocetirinze) 5mg  - Can be purchased over-the-counter if not covered by insurance.  Allergic Conjunctivitis:  - Start Allergy Eye drops-Cromolyn-1 drop each eye up to 4 times daily as needed and Rewetting Drops such as Systane,TheraTears, etc  -Avoid eye drops that say red eye relief as they may contain medications that dry out your eyes.  Mild Intermittent  Asthma: well Controlled  - your lung testing today looked good!  PLAN:  - Spacer use reviewed. - Controller Inhaler:  none            ;    - Rinse mouth out after use - Rescue Inhaler: Albuterol (Proair/Ventolin) 2 puffs . Use  every 4-6 hours as needed for chest tightness, wheezing, or coughing.  Can also use 15 minutes prior to exercise if you have symptoms with activity. - Asthma is not controlled if:  - Symptoms are occurring >2 times a week OR  - >2 times a month nighttime awakenings  - You are requiring systemic steroids (prednisone/steroid injections) more than once per year  - Your require  hospitalization for your asthma.  - Please call the clinic to schedule a follow up if these symptoms arise  Atopic Dermatitis:  Daily Care For Maintenance (daily and continue even once eczema controlled) - Recommend hypoallergenic hydrating ointment at least twice daily.  This must be done daily for control of flares. (Great options include Vaseline, CeraVe, Aquaphor, Aveeno, Cetaphil, VaniCream, etc) - Recommend avoiding detergents, soaps or lotions with fragrances/dyes, and instead using products which are hypoallergenic, use second rinse cycle when washing clothes -Wear lose breathable clothing, avoid wool -Avoid extremes of humidity - Limit showers/baths to 5 minutes and use luke warm water instead of hot, pat dry following baths, and apply moisturizer - can use steroid creams as detailed below up to twice weekly for prevention of flares.  For Flares:(add this to maintenance therapy if needed for flares) - Triamcinolone 0.1% to body for moderate flares-apply topically twice daily to red, raised areas of skin, followed by moisturizer - Hydrocortisone 2.5% to face, armpit or groin-apply topically twice daily to red, raised areas of skin, followed by moisturizer  Follow up: 3 months   Thank you so much for letting me partake in your care today.  Don't hesitate to reach out if you have any additional concerns!  Ferol Luz, MD  [facility]- [place], [place]  Reducing Pollen Exposure  The American Academy of Allergy, Asthma and Immunology suggests the following steps to reduce your exposure to pollen during allergy seasons.    Do not hang sheets or clothing out to dry;  pollen may collect on these items. Do not mow lawns or spend time around freshly cut grass; mowing stirs up pollen. Keep windows closed at night.  Keep car windows closed while driving. Minimize morning activities outdoors, a time when pollen counts are usually at their highest. Stay indoors as much as  possible when pollen counts or humidity is high and on windy days when pollen tends to remain in the air longer. Use air conditioning when possible.  Many air conditioners have filters that trap the pollen spores. Use a HEPA room air filter to remove pollen form the indoor air you breathe.  Control of Dog or Cat Allergen  Avoidance is the best way to manage a dog or cat allergy. If you have a dog or cat and are allergic to dog or cats, consider removing the dog or cat from the home. If you have a dog or cat but don't want to find it a new home, or if your family wants a pet even though someone in the household is allergic, here are some strategies that may help keep symptoms at bay:  Keep the pet out of your bedroom and restrict it to only a few rooms. Be advised that keeping the dog or cat in only one room will not limit the allergens to that room. Don't pet, hug or kiss the dog or cat; if you do, wash your hands with soap and water. High-efficiency particulate air (HEPA) cleaners run continuously in a bedroom or living room can reduce allergen levels over time. Regular use of a high-efficiency vacuum cleaner or a central vacuum can reduce allergen levels. Giving your dog or cat a bath at least once a week can reduce airborne allergen.  Control of Mold Allergen   Mold and fungi can grow on a variety of surfaces provided certain temperature and moisture conditions exist.  Outdoor molds grow on plants, decaying vegetation and soil.  The major outdoor mold, Alternaria and Cladosporium, are found in very high numbers during hot and dry conditions.  Generally, a late Summer - Fall peak is seen for common outdoor fungal spores.  Rain will temporarily lower outdoor mold spore count, but counts rise rapidly when the rainy period ends.  The most important indoor molds are Aspergillus and Penicillium.  Dark, humid and poorly ventilated basements are ideal sites for mold growth.  The next most common sites of  mold growth are the bathroom and the kitchen.  Outdoor (Seasonal) Mold Control   Use air conditioning and keep windows closed Avoid exposure to decaying vegetation. Avoid leaf raking. Avoid grain handling. Consider wearing a face mask if working in moldy areas.    Indoor (Perennial) Mold Control    Maintain humidity below 50%. Clean washable surfaces with 5% bleach solution. Remove sources e.g. contaminated carpets.   DUST MITE AVOIDANCE MEASURES:  There are three main measures that need and can be taken to avoid house dust mites:  Reduce accumulation of dust in general -reduce furniture, clothing, carpeting, books, stuffed animals, especially in bedroom  Separate yourself from the dust -use pillow and mattress encasements (can be found at stores such as [employer] or online) -avoid direct exposure to air condition flow -use a HEPA filter device, especially in the bedroom; you can also use a HEPA filter vacuum cleaner -wipe dust with a moist towel instead of a dry towel or broom when cleaning  Decrease mites and/or their secretions -wash clothing and linen and stuffed animals at highest  temperature possible, at least every 2 weeks -stuffed animals can also be placed in a bag and put in a freezer overnight  Despite the above measures, it is impossible to eliminate dust mites or their allergen completely from your home.  With the above measures the burden of mites in your home can be diminished, with the goal of minimizing your allergic symptoms.  Success will be reached only when implementing and using all means together.

## 2023-01-20 ENCOUNTER — Telehealth: Payer: Self-pay | Admitting: Internal Medicine

## 2023-01-20 ENCOUNTER — Other Ambulatory Visit: Payer: Self-pay

## 2023-01-20 MED ORDER — AZELASTINE-FLUTICASONE 137-50 MCG/ACT NA SUSP: 1.0000 | Freq: Two times a day (BID) | NASAL | 5 refills | Status: DC

## 2023-01-20 MED ORDER — CROMOLYN SODIUM 4 % OP SOLN: 1.0000 [drp] | Freq: Four times a day (QID) | OPHTHALMIC | 12 refills | Status: DC

## 2023-01-20 MED ORDER — TRIAMCINOLONE ACETONIDE 0.1 % EX OINT: TOPICAL_OINTMENT | CUTANEOUS | 1 refills | Status: DC

## 2023-01-20 MED ORDER — HYDROCORTISONE 2.5 % EX CREA: TOPICAL_CREAM | Freq: Two times a day (BID) | CUTANEOUS | 5 refills | Status: DC

## 2023-01-20 MED ORDER — HYDROCORTISONE 2.5 % EX CREA: TOPICAL_CREAM | Freq: Two times a day (BID) | CUTANEOUS | 1 refills | Status: DC

## 2023-01-20 NOTE — Telephone Encounter (Signed)
Patient called requesting to have all her medication transferred to [facility] mail order. Patient states she does not want the Singulair sent to that pharmacy as she does not take it anymore.

## 2023-01-20 NOTE — Telephone Encounter (Signed)
Have sent all meds to [facility] excluding montelukast

## 2023-01-25 ENCOUNTER — Other Ambulatory Visit: Payer: Self-pay

## 2023-01-25 MED ORDER — TRIAMCINOLONE ACETONIDE 0.1 % EX OINT: TOPICAL_OINTMENT | CUTANEOUS | 1 refills | Status: DC

## 2023-01-27 ENCOUNTER — Telehealth: Payer: Self-pay

## 2023-01-27 NOTE — Telephone Encounter (Signed)
[facility] called to see if it's okay not to have a refill attached to script. I told her that's fine to take the 1 refill off.  ?????

## 2023-02-22 ENCOUNTER — Ambulatory Visit: Payer: BC Managed Care – PPO | Admitting: Podiatry

## 2023-03-01 ENCOUNTER — Ambulatory Visit: Payer: BC Managed Care – PPO | Admitting: Podiatry

## 2023-03-18 ENCOUNTER — Ambulatory Visit: Payer: BC Managed Care – PPO | Admitting: Internal Medicine

## 2023-04-14 ENCOUNTER — Ambulatory Visit: Payer: BC Managed Care – PPO | Admitting: Internal Medicine

## 2023-05-06 ENCOUNTER — Ambulatory Visit: Payer: BC Managed Care – PPO | Admitting: Internal Medicine

## 2023-05-13 ENCOUNTER — Ambulatory Visit (INDEPENDENT_AMBULATORY_CARE_PROVIDER_SITE_OTHER): Payer: BC Managed Care – PPO | Admitting: Internal Medicine

## 2023-05-13 ENCOUNTER — Other Ambulatory Visit: Payer: Self-pay

## 2023-05-13 ENCOUNTER — Encounter: Payer: Self-pay | Admitting: Internal Medicine

## 2023-05-13 VITALS — BP 130/90 | HR 94 | Temp 98.0°F | Resp 12 | Ht 63.58 in | Wt 291.6 lb

## 2023-05-13 DIAGNOSIS — H1045 Other chronic allergic conjunctivitis: Secondary | ICD-10-CM

## 2023-05-13 DIAGNOSIS — L2084 Intrinsic (allergic) eczema: Secondary | ICD-10-CM | POA: Diagnosis not present

## 2023-05-13 DIAGNOSIS — J4520 Mild intermittent asthma, uncomplicated: Secondary | ICD-10-CM

## 2023-05-13 DIAGNOSIS — J3089 Other allergic rhinitis: Secondary | ICD-10-CM

## 2023-05-13 DIAGNOSIS — J302 Other seasonal allergic rhinitis: Secondary | ICD-10-CM

## 2023-05-13 MED ORDER — AZELASTINE-FLUTICASONE 137-50 MCG/ACT NA SUSP: 1.0000 | Freq: Two times a day (BID) | NASAL | 5 refills | Status: DC

## 2023-05-13 MED ORDER — CROMOLYN SODIUM 4 % OP SOLN: 1.0000 [drp] | Freq: Four times a day (QID) | OPHTHALMIC | 12 refills | Status: DC

## 2023-05-13 NOTE — Patient Instructions (Addendum)
 Chronic Rhinitis Seasonal and Perennial Allergic: well controlled  - allergy  testing 8/24: Grass, weeds, trees, mold mix 4, dust mite, cat  - Prevention:  - allergen avoidance when possible - consider allergy  shots as long term control of your symptoms by teaching your immune system to be more tolerant of your allergy  triggers  - Symptom control: - Continue  Dymista   1 spray in each nostril twice a day as needed for nasal congestion/itchy nose - Continue Singulair  (Montelukast ) 10mg  nightly.  - Continue Antihistamine: daily or daily as needed.   -Options include Zyrtec (Cetirizine) 10mg , Claritin (Loratadine) 10mg , Allegra (Fexofenadine) 180mg , or Xyzal (Levocetirinze) 5mg  - Can be purchased over-the-counter if not covered by insurance.  Allergic Conjunctivitis:  - Continue Allergy  Eye drops-Cromolyn -1 drop each eye up to 4 times daily as needed and Rewetting Drops such as Systane,TheraTears, etc  -Avoid eye drops that say red eye relief as they may contain medications that dry out your eyes.  Mild Intermittent  Asthma: controlled  - your lung testing today showed possible inflammation  - However based on symptoms and given discrepancy in previous spirometry with the wrong age at this point we will continue to monitor for symptoms closely.  PLAN:  - Spacer use reviewed. - Controller Inhaler:  none            ;    - Rinse mouth out after use - Rescue Inhaler: Albuterol  (Proair /Ventolin ) 2 puffs . Use  every 4-6 hours as needed for chest tightness, wheezing, or coughing.  Can also use 15 minutes prior to exercise if you have symptoms with activity. - Asthma is not controlled if:  - Symptoms are occurring >2 times a week OR  - >2 times a month nighttime awakenings  - You are requiring systemic steroids (prednisone /steroid injections) more than once per year  - Your require hospitalization for your asthma.  - Please call the clinic to schedule a follow up if these symptoms arise  Atopic  Dermatitis: controlled  Daily Care For Maintenance (daily and continue even once eczema controlled) - Recommend hypoallergenic hydrating ointment at least twice daily.  This must be done daily for control of flares. (Great options include Vaseline, CeraVe, Aquaphor, Aveeno, Cetaphil, VaniCream, etc) - Recommend avoiding detergents, soaps or lotions with fragrances/dyes, and instead using products which are hypoallergenic, use second rinse cycle when washing clothes -Wear lose breathable clothing, avoid wool -Avoid extremes of humidity - Limit showers/baths to 5 minutes and use luke warm water instead of hot, pat dry following baths, and apply moisturizer - can use steroid creams as detailed below up to twice weekly for prevention of flares.  For Flares:(add this to maintenance therapy if needed for flares) - Triamcinolone  0.1% to body for moderate flares-apply topically twice daily to red, raised areas of skin, followed by moisturizer - Hydrocortisone  2.5% to face, armpit or groin-apply topically twice daily to red, raised areas of skin, followed by moisturizer  Follow up: 4 months   Thank you so much for letting me partake in your care today.  Don't hesitate to reach out if you have any additional concerns!  Hargis Springer, MD  [facility] , [place]

## 2023-05-13 NOTE — Progress Notes (Signed)
 FOLLOW UP Date of Service/Encounter:  05/13/23  Subjective:  [name] [name] (DOB: 03-Jan-1971) is a 53 y.o. female who [facility] in re-evaluation of the following: asthma, rhinoconjuncitivitis, atopic dermatitis  History obtained from: chart review and patient.  For Review, LV was on 12/17/22  with Dr. Lorin seen for intial visit for asthma, rhinoconjunctivitis, atopic dermatitis . See below for summary of history and diagnostics.   Today presents for follow-up. Discussed the use of AI scribe software for clinical note transcription with the patient, who gave verbal consent to proceed.  History of Present Illness   The patient, with a known diagnosis of asthma, presented for a follow-up visit. They reported feeling well overall, with no current complaints of cough, wheeze, or shortness of breath. They denied needing to use their albuterol  inhaler recently.  The patient requested a renewal of their prescription for eye drops and nasal spray, which they reported as effective. They planned to use these medications throughout the winter season, as they experienced severe eye symptoms in the previous spring. However, they denied any respiratory symptoms such as cough, wheeze, or shortness of breath during that time.  There was a concern regarding the patient's spirometry results due to an initial error in inputting the patient's age. After correction and retesting, the results were lower than prior.  However this may be effort dependent.  She is open to monitoring symptoms prefers this option versus starting controller therapy given she feels like she has no current asthma symptoms.        All medications reviewed by clinical staff and updated in chart. No new pertinent medical or surgical history except as noted in HPI.  ROS: All others negative except as noted per HPI.   Objective:  BP (!) 130/90 (BP Location: Right Arm, Patient Position:  Sitting, Cuff Size: Large)   Pulse 94   Temp 98 F (36.7 C) (Temporal)   Resp 12   Ht 5' 3.58 (1.615 m)   Wt 291 lb 9.6 oz (132.3 kg)   SpO2 95%   BMI 50.71 kg/m  Body mass index is 50.71 kg/m. Physical Exam: General Appearance:  Alert, cooperative, no distress, appears stated age  Head:  Normocephalic, without obvious abnormality, atraumatic  Eyes:  Conjunctiva clear, EOM's intact  Ears EACs normal bilaterally  Nose: Nares normal, normal mucosa, no visible anterior polyps, and septum midline  Throat: Lips, tongue normal; teeth and gums normal, normal posterior oropharynx  Neck: Supple, symmetrical  Lungs:   clear to auscultation bilaterally, Respirations unlabored, no coughing  Heart:  regular rate and rhythm and no murmur, Appears well perfused  Extremities: No edema  Skin: Skin color, texture, turgor normal and no rashes or lesions on visualized portions of skin  Neurologic: No gross deficits   Labs:  Lab Orders  No laboratory test(s) ordered today    Spirometry:  Tracings reviewed. Her effort: Good reproducible efforts. FVC: 1.75L FEV1: 1.32L, 60% predicted FEV1/FVC ratio: 75% Interpretation: Nonobstructive ratio, low FEV1, possible restriction.  Please see scanned spirometry results for details.   Assessment/Plan   Chronic Rhinitis Seasonal and Perennial Allergic: well controlled  - allergy  testing 8/24: Grass, weeds, trees, mold mix 4, dust mite, cat  - Prevention:  - allergen avoidance when possible - consider allergy  shots as long term control of your symptoms by teaching your immune system to be more tolerant of your allergy  triggers  - Symptom control: - Continue Dymista  1 spray  in each nostril twice a day as needed for nasal congestion/itchy nose - Continue Singulair  (Montelukast ) 10mg  nightly.  - Continue Antihistamine: daily or daily as needed.   -Options include Zyrtec (Cetirizine) 10mg , Claritin (Loratadine) 10mg , Allegra (Fexofenadine) 180mg , or  Xyzal (Levocetirinze) 5mg  - Can be purchased over-the-counter if not covered by insurance.  Allergic Conjunctivitis:  - Continue Allergy  Eye drops-Cromolyn -1 drop each eye up to 4 times daily as needed and Rewetting Drops such as Systane,TheraTears, etc  -Avoid eye drops that say red eye relief as they may contain medications that dry out your eyes.  Mild Intermittent  Asthma: controlled  - your lung testing today showed possible inflammation  - However based on symptoms and given discrepancy in previous spirometry with the wrong age at this point we will continue to monitor for symptoms closely.  PLAN:  - Spacer use reviewed. - Controller Inhaler: none       ;   - Rinse mouth out after use - Rescue Inhaler: Albuterol  (Proair /Ventolin ) 2 puffs . Use  every 4-6 hours as needed for chest tightness, wheezing, or coughing.  Can also use 15 minutes prior to exercise if you have symptoms with activity. - Asthma is not controlled if:  - Symptoms are occurring >2 times a week OR  - >2 times a month nighttime awakenings  - You are requiring systemic steroids (prednisone /steroid injections) more than once per year  - Your require hospitalization for your asthma.  - Please call the clinic to schedule a follow up if these symptoms arise  Atopic Dermatitis: controlled  Daily Care For Maintenance (daily and continue even once eczema controlled) - Recommend hypoallergenic hydrating ointment at least twice daily.  This must be done daily for control of flares. (Great options include Vaseline, CeraVe, Aquaphor, Aveeno, Cetaphil, VaniCream, etc) - Recommend avoiding detergents, soaps or lotions with fragrances/dyes, and instead using products which are hypoallergenic, use second rinse cycle when washing clothes -Wear lose breathable clothing, avoid wool -Avoid extremes of humidity - Limit showers/baths to 5 minutes and use luke warm water instead of hot, pat dry following baths, and apply moisturizer -  can use steroid creams as detailed below up to twice weekly for prevention of flares.  For Flares:(add this to maintenance therapy if needed for flares) - Triamcinolone  0.1% to body for moderate flares-apply topically twice daily to red, raised areas of skin, followed by moisturizer - Hydrocortisone  2.5% to face, armpit or groin-apply topically twice daily to red, raised areas of skin, followed by moisturizer  Follow up: 4 months   Other: reviewed spirometry technique and reviewed inhaler technique  Thank you so much for letting me partake in your care today.  Don't hesitate to reach out if you have any additional concerns!  Hargis Springer, MD  [facility]- [place], [place]

## 2023-05-31 ENCOUNTER — Ambulatory Visit: Payer: BC Managed Care – PPO | Admitting: Podiatry

## 2023-06-13 ENCOUNTER — Ambulatory Visit: Payer: BC Managed Care – PPO | Admitting: Podiatry

## 2023-06-16 ENCOUNTER — Other Ambulatory Visit: Payer: Self-pay | Admitting: Internal Medicine

## 2023-06-18 NOTE — Progress Notes (Unsigned)
 Patient ID: [name] [name], female    DOB: [birth year], 53 y.o.   MRN: [id number]  HPI female never smoker followed for OSA, asthma, obesity/hypoventilation (previous gastric bypass bariatric surgery), complicated by morbid obesity NPSG 04/21/03  AHI 101/ hr, CPAP to 18, weight was 400 lbs HST 12/11/18- AHI 6.3/ hr, desaturation to 80%, body weight 301 lbs --------------------------------------------------------------------------------------------------    04/13/22-  53 year old female never smoker followed for OSA/ noncompliant w CPAP, asthma, obesity/hypoventilation (previous gastric bypass bariatric surgery), complicated by morbid obesity, BIPolar, Covid Pneumonia 2021,  CPAP auto 5-15 Download- 33%, AHI 6.5/ hr Body weight today-300 lbs Covid vax-4 Phizer Flu vax- had ED 04/02/22- Influenza A She feels she has resolved symptoms of influenza which were treated with over-the-counter meds.  She feels back to baseline with her breathing now. Still struggles with CPAP compliance.  We reviewed this again with discussion of download, goals, alternatives. Encouraged weight loss. CXR 04/02/22-  IMPRESSION: Mild cardiomegaly with mild central pulmonary vascular congestion.  06/21/23-  53 year old female never smoker followed for OSA/ poorly compliant w CPAP, asthma, obesity/hypoventilation (previous gastric bypass bariatric surgery), complicated by morbid obesity, BIPolar, Covid Pneumonia 2021,  Followed by Allergy for asthma/ rhinitis -Trazodone 100 CPAP auto 5-15/ [facility]  Airsense 10 Download compliance 43%, AHI 1.2/hr Body weight today-299 lbs Will update CXR- prior cardiomegaly. Renew HC Parking. Discussed the use of AI scribe software for clinical note transcription with the patient, who gave verbal consent to proceed.  History of Present Illness   The patient, with a history of sleep apnea and stress, reports no current illness. She recently received the influenza and COVID-19  vaccines. She is using a CPAP machine, but admits to occasionally forgetting to put it on before falling asleep. Despite this, she reports no changes in her breathing. She also reports stress-related sleep disturbances, which are managed with Trazodone. She finds this medication helpful and reports no adverse effects. She sleeps through the night but does wake up occasionally, which she understands is normal.  In addition to sleep apnea and stress, the patient has been dealing with allergies. She has been seeing an allergist who prescribed a nasal spray and eye drops. These medications have been helpful, and the patient hopes to see further improvement by spring. She also mentions a burning sensation in her eyes last year, which she initially attributed to allergies. However, she is now considering the possibility of dry eyes due to antihistamines. Previous CXR showed cardiomegaly- will update.     Review of Systems + = positive Constitutional:   No-   weight loss, night sweats, fevers, chills, fatigue, lassitude. HEENT:   No-  headaches, difficulty swallowing, tooth/dental problems, sore throat,       Occasional  sneezing, itching, ear ache, nasal congestion, post nasal drip,  CV:  No-   chest pain, orthopnea, PND, swelling in lower extremities, anasarca, dizziness, palpitations Resp: No- acute  shortness of breath with exertion or at rest.              No-   productive cough,  +non-productive cough,  No- coughing up of blood.              No-   change in color of mucus.  No- wheezing.   Skin: No-   rash or lesions. GI:  No-   heartburn, indigestion, abdominal pain, nausea, vomiting,  GU:  MS:  No-   joint pain or swelling.   Neuro-     nothing  unusual Psych:  No- change in mood or affect. No depression or anxiety.  No memory loss.    Objective:   Physical Exam General- Alert, Oriented, Affect-appropriate, Distress- none acute, + Morbidly obese Skin- rash-none, lesions- none, excoriation-  none Lymphadenopathy- none Head- atraumatic            Eyes- Gross vision intact, PERRLA, conjunctivae clear secretions            Ears- Hearing, canals normal            Nose- Clear, No- Septal dev, mucus, polyps, erosion, perforation             Throat- Mallampati II-III , mucosa clear , drainage- none, tonsils- atrophic Neck- flexible , trachea midline, no stridor , thyroid nl, carotid no bruit Chest - symmetrical excursion , unlabored           Heart/CV- RRR , no murmur , no gallop  , no rub, nl s1 s2                           - JVD- none , edema- none, stasis changes- none, varices- none           Lung- clear to P&A, wheeze- none,  dullness-none, rub- none                Chest wall-  Abd- Br/ Gen/ Rectal- Not done, not indicated Extrem- cyanosis- none, clubbing, none, atrophy- none, strength- nl Neuro- grossly intact to observation  Assessment and Plan    Sleep Apnea Patient is using CPAP inconsistently, but when used, it provides excellent control of sleep apnea (AHI <2). No changes in breathing reported. -Encourage consistent use of CPAP. -Continue current CPAP settings (5-15).  Insomnia Patient reports stress-related sleep disturbances. Trazodone is helping and is well-tolerated. -Continue Trazodone as needed for sleep.  Allergic Rhinitis Patient is seeing an allergist for allergies and asthma. Nasal spray and eye drops have been prescribed by the allergist. -Continue current treatment as directed by allergist. -Consider discussing potential dryness from antihistamines with allergist and eye doctor. -Update CXR  General Health Maintenance -Continue to receive CPAP supplies from [facility] as needed. -Complete and return handicap parking form.

## 2023-06-21 ENCOUNTER — Encounter: Payer: Self-pay | Admitting: Internal Medicine

## 2023-06-21 ENCOUNTER — Ambulatory Visit (INDEPENDENT_AMBULATORY_CARE_PROVIDER_SITE_OTHER): Payer: BC Managed Care – PPO | Admitting: Internal Medicine

## 2023-06-21 ENCOUNTER — Ambulatory Visit: Payer: BC Managed Care – PPO

## 2023-06-21 VITALS — BP 136/90 | HR 94 | Ht 65.0 in | Wt 299.6 lb

## 2023-06-21 DIAGNOSIS — J4531 Mild persistent asthma with (acute) exacerbation: Secondary | ICD-10-CM

## 2023-06-21 NOTE — Patient Instructions (Signed)
 We can continue CPAP auto 5-15. Please try to use it every night- its meant to be good for you,  Ok to continue trazodone for sleep.

## 2023-07-06 NOTE — Progress Notes (Signed)
 Message sent in my chart

## 2023-08-05 ENCOUNTER — Other Ambulatory Visit: Payer: Self-pay

## 2023-08-05 ENCOUNTER — Ambulatory Visit: Payer: BC Managed Care – PPO | Admitting: Internal Medicine

## 2023-08-05 ENCOUNTER — Encounter: Payer: Self-pay | Admitting: Internal Medicine

## 2023-08-05 VITALS — BP 128/82 | HR 88 | Temp 97.9°F | Resp 17

## 2023-08-05 DIAGNOSIS — J3089 Other allergic rhinitis: Secondary | ICD-10-CM | POA: Diagnosis not present

## 2023-08-05 DIAGNOSIS — L2084 Intrinsic (allergic) eczema: Secondary | ICD-10-CM | POA: Diagnosis not present

## 2023-08-05 DIAGNOSIS — H1045 Other chronic allergic conjunctivitis: Secondary | ICD-10-CM | POA: Diagnosis not present

## 2023-08-05 DIAGNOSIS — J4540 Moderate persistent asthma, uncomplicated: Secondary | ICD-10-CM | POA: Diagnosis not present

## 2023-08-05 DIAGNOSIS — J302 Other seasonal allergic rhinitis: Secondary | ICD-10-CM

## 2023-08-05 MED ORDER — ALBUTEROL SULFATE HFA 108 (90 BASE) MCG/ACT IN AERS: 2.0000 | INHALATION_SPRAY | RESPIRATORY_TRACT | 1 refills | Status: DC | PRN

## 2023-08-05 MED ORDER — PREDNISONE 10 MG PO TABS: ORAL_TABLET | ORAL | 0 refills | Status: AC

## 2023-08-05 MED ORDER — HYDROCORTISONE 2.5 % EX CREA: TOPICAL_CREAM | Freq: Two times a day (BID) | CUTANEOUS | 1 refills | Status: DC

## 2023-08-05 MED ORDER — TRIAMCINOLONE ACETONIDE 0.1 % EX OINT: TOPICAL_OINTMENT | CUTANEOUS | 1 refills | Status: DC

## 2023-08-05 NOTE — Progress Notes (Signed)
 FOLLOW UP Date of Service/Encounter:  08/05/23  Subjective:  [name] [name] (DOB: [birth year]) is a 53 y.o. female who [facility] in re-evaluation of the following: asthma, rhinoconjuncitivitis, atopic dermatitis  History obtained from: chart review and patient.  For Review, LV was on 05/13/23 with Dr. Marlynn Perking seen for routine follow-up.   Today presents for follow-up. Discussed the use of AI scribe software for clinical note transcription with the patient, who gave verbal consent to proceed.  History of Present Illness   Discussed the use of AI scribe software for clinical note transcription with the patient, who gave verbal consent to proceed.  History of Present Illness [name] [name] is a 53 year old female who presents with worsening eye allergy symptoms.  She experiences worsening symptoms of itchy, watery eyes, particularly affecting her left eye. Despite using prescribed cromolyn eye drops more than four times a day, they provide only partial relief. She also notes the presence of darker circles under her eyes, which she attributes to rubbing due to the allergies. No vision changes are reported.  Her current medication regimen includes Dymista nasal spray used one to two sprays per day, and cetirizine taken once daily. She is not currently using Singulair. Despite these treatments, her left eye continues to cause significant discomfort.  Her symptoms have been exacerbated by high pollen counts, which she has experienced in previous years as well. She experiences a runny nose infrequently.  No cough, wheeze, or shortness of breath. She uses her rescue inhaler less than twice a week and does not require her skin creams at this time.  Overall her asthma and eczema is well-controlled but her rhinoconjunctivitis symptoms are flaring  All medications reviewed by clinical staff and updated in chart. No new pertinent medical or surgical  history except as noted in HPI.  ROS: All others negative except as noted per HPI.   Objective:  BP 128/82 (BP Location: Left Arm, Patient Position: Sitting, Cuff Size: Large)   Pulse 88   Temp 97.9 F (36.6 C) (Temporal)   Resp 17   SpO2 95%  There is no height or weight on file to calculate BMI. Physical Exam: General Appearance:  Alert, cooperative, no distress, appears stated age  Head:  Normocephalic, without obvious abnormality, atraumatic  Eyes:  Conjunctiva clear, EOM's intact  Ears EACs normal bilaterally  Nose: Nares normal, normal mucosa, no visible anterior polyps, and septum midline  Throat: Lips, tongue normal; teeth and gums normal, normal posterior oropharynx  Neck: Supple, symmetrical  Lungs:   clear to auscultation bilaterally, Respirations unlabored, no coughing  Heart:  regular rate and rhythm and no murmur, Appears well perfused  Extremities: No edema  Skin: Skin color, texture, turgor normal and no rashes or lesions on visualized portions of skin  Neurologic: No gross deficits   Labs:  Lab Orders  No laboratory test(s) ordered today    Spirometry:  Tracings reviewed. Her effort: Good reproducible efforts. FVC: 1.62L FEV1: 1.22L, 59% predicted FEV1/FVC ratio: 75% Interpretation: Nonobstructive ratio, low FEV1, possible restriction.  No significant postbronchodilator response after 4 puffs of albuterol Please see scanned spirometry results for details.   Assessment/Plan     Chronic Rhinitis Seasonal and Perennial Allergic: flaring  Start prednisone 10mg  twice daily for 5 days  - allergy testing 8/24: Grass, weeds, trees, mold mix 4, dust mite, cat  - Prevention:  - allergen avoidance when possible - consider allergy shots as long  term control of your symptoms by teaching your immune system to be more tolerant of your allergy triggers  - Symptom control: - Continue  Dymista  1 spray in each nostril twice a day as needed for nasal congestion/itchy  nose - Continue Singulair (Montelukast) 10mg  nightly.  - Continue Antihistamine: daily or daily as needed.   -Options include Zyrtec (Cetirizine) 10mg , Claritin (Loratadine) 10mg , Allegra (Fexofenadine) 180mg , or Xyzal (Levocetirinze) 5mg  - Can be purchased over-the-counter if not covered by insurance.  Allergic Conjunctivitis: flaring  - Continue Allergy Eye drops-Cromolyn-1 drop each eye up to 4 times daily as needed and Rewetting Drops such as Systane,TheraTears, etc  -Avoid eye drops that say red eye relief as they may contain medications that dry out your eyes.  Mild Intermittent  Asthma: controlled  - your lung testing today showed possible inflammation that was not reversible with albuterol   PLAN:  - Spacer use reviewed. - Controller Inhaler:  none            ;    - Rinse mouth out after use - Rescue Inhaler: Albuterol (Proair/Ventolin) 2 puffs . Use  every 4-6 hours as needed for chest tightness, wheezing, or coughing.  Can also use 15 minutes prior to exercise if you have symptoms with activity. - Asthma is not controlled if:  - Symptoms are occurring >2 times a week OR  - >2 times a month nighttime awakenings  - You are requiring systemic steroids (prednisone/steroid injections) more than once per year  - Your require hospitalization for your asthma.  - Please call the clinic to schedule a follow up if these symptoms arise  Atopic Dermatitis: controlled  Daily Care For Maintenance (daily and continue even once eczema controlled) - Recommend hypoallergenic hydrating ointment at least twice daily.  This must be done daily for control of flares. (Great options include Vaseline, CeraVe, Aquaphor, Aveeno, Cetaphil, VaniCream, etc) - Recommend avoiding detergents, soaps or lotions with fragrances/dyes, and instead using products which are hypoallergenic, use second rinse cycle when washing clothes -Wear lose breathable clothing, avoid wool -Avoid extremes of humidity - Limit  showers/baths to 5 minutes and use luke warm water instead of hot, pat dry following baths, and apply moisturizer - can use steroid creams as detailed below up to twice weekly for prevention of flares.  For Flares:(add this to maintenance therapy if needed for flares) - Triamcinolone 0.1% to body for moderate flares-apply topically twice daily to red, raised areas of skin, followed by moisturizer - Hydrocortisone 2.5% to face, armpit or groin-apply topically twice daily to red, raised areas of skin, followed by moisturizer  Follow up: 6 months   Thank you so much for letting me partake in your care today.  Don't hesitate to reach out if you have any additional concerns!  Ferol Luz, MD  [facility] [place], [place]    Other: reviewed spirometry technique and reviewed inhaler technique  Thank you so much for letting me partake in your care today.  Don't hesitate to reach out if you have any additional concerns!  Ferol Luz, MD  [facility]- [place], [place]

## 2023-08-05 NOTE — Patient Instructions (Addendum)
 Chronic Rhinitis Seasonal and Perennial Allergic: flaring  Start prednisone 10mg  twice daily for 5 days  - allergy testing 8/24: Grass, weeds, trees, mold mix 4, dust mite, cat  - Prevention:  - allergen avoidance when possible - consider allergy shots as long term control of your symptoms by teaching your immune system to be more tolerant of your allergy triggers  - Symptom control: - Continue  Dymista  1 spray in each nostril twice a day as needed for nasal congestion/itchy nose - Continue Singulair (Montelukast) 10mg  nightly.  - Continue Antihistamine: daily or daily as needed.   -Options include Zyrtec (Cetirizine) 10mg , Claritin (Loratadine) 10mg , Allegra (Fexofenadine) 180mg , or Xyzal (Levocetirinze) 5mg  - Can be purchased over-the-counter if not covered by insurance.  Allergic Conjunctivitis: flaring  - Continue Allergy Eye drops-Cromolyn-1 drop each eye up to 4 times daily as needed and Rewetting Drops such as Systane,TheraTears, etc  -Avoid eye drops that say red eye relief as they may contain medications that dry out your eyes.  Mild Intermittent  Asthma: controlled  - your lung testing today showed possible inflammation that was not reversible with albuterol   PLAN:  - Spacer use reviewed. - Controller Inhaler:  none            ;    - Rinse mouth out after use - Rescue Inhaler: Albuterol (Proair/Ventolin) 2 puffs . Use  every 4-6 hours as needed for chest tightness, wheezing, or coughing.  Can also use 15 minutes prior to exercise if you have symptoms with activity. - Asthma is not controlled if:  - Symptoms are occurring >2 times a week OR  - >2 times a month nighttime awakenings  - You are requiring systemic steroids (prednisone/steroid injections) more than once per year  - Your require hospitalization for your asthma.  - Please call the clinic to schedule a follow up if these symptoms arise  Atopic Dermatitis: controlled  Daily Care For Maintenance (daily and  continue even once eczema controlled) - Recommend hypoallergenic hydrating ointment at least twice daily.  This must be done daily for control of flares. (Great options include Vaseline, CeraVe, Aquaphor, Aveeno, Cetaphil, VaniCream, etc) - Recommend avoiding detergents, soaps or lotions with fragrances/dyes, and instead using products which are hypoallergenic, use second rinse cycle when washing clothes -Wear lose breathable clothing, avoid wool -Avoid extremes of humidity - Limit showers/baths to 5 minutes and use luke warm water instead of hot, pat dry following baths, and apply moisturizer - can use steroid creams as detailed below up to twice weekly for prevention of flares.  For Flares:(add this to maintenance therapy if needed for flares) - Triamcinolone 0.1% to body for moderate flares-apply topically twice daily to red, raised areas of skin, followed by moisturizer - Hydrocortisone 2.5% to face, armpit or groin-apply topically twice daily to red, raised areas of skin, followed by moisturizer  Follow up: 6 months   Thank you so much for letting me partake in your care today.  Don't hesitate to reach out if you have any additional concerns!  Ferol Luz, MD  [facility] [place], [place]

## 2023-08-08 ENCOUNTER — Other Ambulatory Visit: Payer: Self-pay | Admitting: Internal Medicine

## 2023-08-08 ENCOUNTER — Telehealth: Payer: Self-pay

## 2023-08-08 ENCOUNTER — Other Ambulatory Visit (HOSPITAL_COMMUNITY): Payer: Self-pay

## 2023-08-08 NOTE — Telephone Encounter (Signed)
*  Asthma/Allergy  Pharmacy Patient Advocate Encounter   Received notification from CoverMyMeds that prior authorization for Albuterol Sulfate HFA 108 (90 Base)MCG/ACT aerosol  is required/requested.   Insurance verification completed.   The patient is insured through [facility] .   Per test claim:  Albuterol HFA 8.5gm is preferred by the insurance.  If suggested medication is appropriate, Please send in a new RX and discontinue this one. If not, please advise as to why it's not appropriate so that we may request a Prior Authorization. Please note, some preferred medications may still require a PA.  If the suggested medications have not been trialed and there are no contraindications to their use, the PA will not be submitted, as it will not be approved.   CMM Key: [id number]

## 2023-08-09 MED ORDER — ALBUTEROL SULFATE HFA 108 (90 BASE) MCG/ACT IN AERS: 2.0000 | INHALATION_SPRAY | Freq: Four times a day (QID) | RESPIRATORY_TRACT | 1 refills | Status: DC | PRN

## 2023-08-09 NOTE — Addendum Note (Signed)
 Addended by: Floydene Flock C on: 08/09/2023 11:03 AM   Modules accepted: Orders

## 2023-08-09 NOTE — Telephone Encounter (Signed)
 Can we switch to approved albuterol HFA 8.5gm?

## 2023-09-05 ENCOUNTER — Telehealth: Payer: Self-pay | Admitting: Internal Medicine

## 2023-09-05 NOTE — Telephone Encounter (Signed)
 Pt request refill for cromolyn  and requesting a call back about  another round of prednisone .

## 2023-09-06 NOTE — Telephone Encounter (Signed)
 Called patient - DOB/DPR verified - LMOVM advising patient Cromolyn  has 12 refills when prescribed on 05/13/23 - she can contact [facility] regarding refill.  Patient also advised need current symptoms she's having, when did they begin, what has she tried, office visit may be required for Prednisone  refill (last prescribed: 08/05/23).  Patient also advised to either send myChart message to provider with her Prednisone  request/symptoms or contact the office.  If/When patient call back - please advise of above notation.

## 2024-02-17 ENCOUNTER — Ambulatory Visit: Admitting: Internal Medicine

## 2024-02-21 ENCOUNTER — Ambulatory Visit: Admitting: Internal Medicine

## 2024-02-24 ENCOUNTER — Ambulatory Visit: Admitting: Internal Medicine

## 2024-03-27 ENCOUNTER — Other Ambulatory Visit: Payer: Self-pay

## 2024-03-27 ENCOUNTER — Ambulatory Visit (INDEPENDENT_AMBULATORY_CARE_PROVIDER_SITE_OTHER): Admitting: Internal Medicine

## 2024-03-27 ENCOUNTER — Encounter: Payer: Self-pay | Admitting: Internal Medicine

## 2024-03-27 VITALS — BP 128/82 | HR 76 | Temp 97.6°F | Ht 62.0 in | Wt 283.2 lb

## 2024-03-27 DIAGNOSIS — J302 Other seasonal allergic rhinitis: Secondary | ICD-10-CM | POA: Diagnosis not present

## 2024-03-27 DIAGNOSIS — J4520 Mild intermittent asthma, uncomplicated: Secondary | ICD-10-CM | POA: Diagnosis not present

## 2024-03-27 DIAGNOSIS — J3089 Other allergic rhinitis: Secondary | ICD-10-CM

## 2024-03-27 DIAGNOSIS — L2084 Intrinsic (allergic) eczema: Secondary | ICD-10-CM | POA: Diagnosis not present

## 2024-03-27 MED ORDER — TRIAMCINOLONE ACETONIDE 0.1 % EX OINT: TOPICAL_OINTMENT | CUTANEOUS | 5 refills | Status: AC

## 2024-03-27 MED ORDER — AZELASTINE-FLUTICASONE 137-50 MCG/ACT NA SUSP: 2.0000 | Freq: Two times a day (BID) | NASAL | 5 refills | Status: AC

## 2024-03-27 MED ORDER — MONTELUKAST SODIUM 10 MG PO TABS: 10.0000 mg | ORAL_TABLET | Freq: Every day | ORAL | 1 refills | Status: AC

## 2024-03-27 MED ORDER — ALBUTEROL SULFATE HFA 108 (90 BASE) MCG/ACT IN AERS: 1.0000 | INHALATION_SPRAY | Freq: Four times a day (QID) | RESPIRATORY_TRACT | 1 refills | Status: AC | PRN

## 2024-03-27 MED ORDER — OLOPATADINE HCL 0.2 % OP SOLN: 1.0000 [drp] | Freq: Every day | OPHTHALMIC | 5 refills | Status: AC | PRN

## 2024-03-27 MED ORDER — HYDROCORTISONE 2.5 % EX CREA: TOPICAL_CREAM | CUTANEOUS | 5 refills | Status: AC

## 2024-03-27 MED ORDER — CROMOLYN SODIUM 4 % OP SOLN: 1.0000 [drp] | Freq: Four times a day (QID) | OPHTHALMIC | 5 refills | Status: AC | PRN

## 2024-03-27 NOTE — Addendum Note (Signed)
 Addended by: Linet Brash on: 03/27/2024 04:45 PM   Modules accepted: Orders

## 2024-03-27 NOTE — Progress Notes (Signed)
 FOLLOW UP Date of Service/Encounter:  03/27/24   Subjective:  [name] [name] (DOB: 06-19-1970) is a 53 y.o. female who [facility] for follow up for asthma, eczema, allergic rhinitis.   History obtained from: chart review and patient. Last visit was with Dr Lorin 08/05/2023: Asthma- controlled on PRN Albuterol  AR- prednisone  for flare ups; continue dymista , singulair , oral anti histamine, cromolyn ; consider AIT  Eczema- controlled on PRN topical steroids and moisturizing  Reports asthma has done well. No trouble with wheezing/coughing/dyspnea. Rarely needs Albuterol , can't recall last use.  No ER/urgent care/oral prednisone  since last visit for flare ups.   Does note some itchy watery eyes and sneezing.  Taking Dymista  PRN, Singulair  daily; she is not on an anti histamine or Cromolyn .  Not interested in AIT at this time.    Eczema has done well overall.  Sometimes gets flare ups on neck but improves with moisturizing. Rarely needs topical steroids.   Past Medical History: Past Medical History:  Diagnosis Date   Acute pulmonary edema (HCC) 07/01/2003   Allergic rhinitis    Chronic respiratory failure (HCC) 07/01/2003   Iron deficiency anemia    d/t menometrorrhagia   Morbid obesity (HCC)    bariatric surgery   OSA (obstructive sleep apnea) 04/21/2003   Pneumonia     Objective:  BP 128/82 (BP Location: Right Arm, Patient Position: Sitting)   Pulse 76   Temp 97.6 F (36.4 C) (Temporal)   Ht 5' 2 (1.575 m)   Wt 283 lb 3.2 oz (128.5 kg)   SpO2 99%   BMI 51.80 kg/m  Body mass index is 51.8 kg/m. Physical Exam: GEN: alert, well developed HEENT: clear conjunctiva, nose with mild inferior turbinate hypertrophy, pink nasal mucosa, clear rhinorrhea, + cobblestoning HEART: regular rate and rhythm, no murmur LUNGS: clear to auscultation bilaterally, no coughing, unlabored respiration SKIN: no rashes or lesions  Spirometry:   Tracings reviewed. Her effort: Good reproducible efforts. FVC: 1.8L, 61% predicted  FEV1: 1.44L, 61% predicted FEV1/FVC ratio: 80% Interpretation: Spirometry consistent with possible restrictive disease.  Please see scanned spirometry results for details.  Assessment:   1. Intrinsic atopic dermatitis   2. Seasonal and perennial allergic rhinitis   3. Mild intermittent asthma without complication     Plan/Recommendations:  Mild Intermittent Asthma: - Well controlled, spirometry today with possible restriction, likely related to obesity  - Rescue inhaler: Albuterol  2 puffs or 1 vial via nebulizer every 4-6 hours as needed for respiratory symptoms of cough, shortness of breath, or wheezing Asthma control goals:  Full participation in all desired activities (may need albuterol  before activity) Albuterol  use two times or less a week on average (not counting use with activity) Cough interfering with sleep two times or less a month Oral steroids no more than once a year No hospitalizations  Allergic Rhinitis: - Uncontrolled, restart OAH and Dysmita BID. Consider AIT.   - Positive skin test 12/2022: grass, weeds, trees, mold mix 4, dust mite, cat  - Use nasal saline rinses before nose sprays such as with Neilmed Sinus Rinse.  Use distilled water.   - Use Dymista  2 sprays each nostril twice daily. Aim upward and outward. - Use Zyrtec 10 mg or Xyzal 5mg  daily as needed for sneezing, itchy watery eyes.  - Use Singulair  10mg  daily.    Stop if there are any mood/behavioral changes. - For eyes, use Olopatadine  or Ketotifen 1 eye drop daily as needed for itchy, watery  eyes.  If this is not covered, can get Cromolyn  1 eye drops up to four times a day as needed for itchy watery eyes.  - Consider allergy  shots as long term control of your symptoms by teaching your immune system to be more tolerant of your allergy  triggers  Eczema: - Controlled  - Do a daily soaking tub bath in warm water for  10-15 minutes.  - Use a gentle, unscented cleanser at the end of the bath (such as Dove unscented bar or baby wash, or Aveeno sensitive body wash). Then rinse, pat half-way dry, and apply a gentle, unscented moisturizer cream or ointment (Cerave, Cetaphil, Eucerin, Aveeno, Aquaphor, Vanicream, Vaseline)  all over while still damp. Dry skin makes the itching and rash of eczema worse. The skin should be moisturized with a gentle, unscented moisturizer at least twice daily.  - Use only unscented liquid laundry detergent. - Apply prescribed topical steroid (triamcinolone  0.1% below neck or hydrocortisone  2.5% above neck) to flared areas (red and thickened eczema) after the moisturizer has soaked into the skin (wait at least 30 minutes). Taper off the topical steroids as the skin improves. Do not use topical steroid for more than 7-10 days at a time.       Return in about 6 months (around 09/24/2024).  Arleta Blanch, MD [facility] [place] 

## 2024-03-27 NOTE — Patient Instructions (Addendum)
 Mild Intermittent Asthma: - Rescue inhaler: Albuterol  2 puffs or 1 vial via nebulizer every 4-6 hours as needed for respiratory symptoms of cough, shortness of breath, or wheezing Asthma control goals:  Full participation in all desired activities (may need albuterol  before activity) Albuterol  use two times or less a week on average (not counting use with activity) Cough interfering with sleep two times or less a month Oral steroids no more than once a year No hospitalizations  Allergic Rhinitis: - Positive skin test 12/2022: grass, weeds, trees, mold mix 4, dust mite, cat  - Use nasal saline rinses before nose sprays such as with Neilmed Sinus Rinse.  Use distilled water.   - Use Dymista  2 sprays each nostril twice daily. Aim upward and outward. - Use Zyrtec 10 mg or Xyzal 5mg  daily as needed for sneezing, itchy watery eyes.  - Use Singulair  10mg  daily.    Stop if there are any mood/behavioral changes. - For eyes, use Olopatadine  or Ketotifen 1 eye drop daily as needed for itchy, watery eyes.  If this is not covered, can get Cromolyn  1 eye drops up to four times a day as needed for itchy watery eyes.  - Consider allergy  shots as long term control of your symptoms by teaching your immune system to be more tolerant of your allergy  triggers  Eczema: - Do a daily soaking tub bath in warm water for 10-15 minutes.  - Use a gentle, unscented cleanser at the end of the bath (such as Dove unscented bar or baby wash, or Aveeno sensitive body wash). Then rinse, pat half-way dry, and apply a gentle, unscented moisturizer cream or ointment (Cerave, Cetaphil, Eucerin, Aveeno, Aquaphor, Vanicream, Vaseline)  all over while still damp. Dry skin makes the itching and rash of eczema worse. The skin should be moisturized with a gentle, unscented moisturizer at least twice daily.  - Use only unscented liquid laundry detergent. - Apply prescribed topical steroid (triamcinolone  0.1% below neck or hydrocortisone  2.5%  above neck) to flared areas (red and thickened eczema) after the moisturizer has soaked into the skin (wait at least 30 minutes). Taper off the topical steroids as the skin improves. Do not use topical steroid for more than 7-10 days at a time.

## 2024-09-25 ENCOUNTER — Ambulatory Visit: Admitting: Internal Medicine
